# Patient Record
Sex: Female | Born: 1980 | Race: White | Hispanic: No | Marital: Married | Smoking: Former smoker
Health system: Southern US, Community
[De-identification: ages and names within clinical notes are randomized; demographics above are authoritative.]

## PROBLEM LIST (undated history)

## (undated) DIAGNOSIS — F419 Anxiety disorder, unspecified: Secondary | ICD-10-CM

## (undated) DIAGNOSIS — F329 Major depressive disorder, single episode, unspecified: Secondary | ICD-10-CM

## (undated) DIAGNOSIS — G43109 Migraine with aura, not intractable, without status migrainosus: Secondary | ICD-10-CM

## (undated) DIAGNOSIS — F32A Depression, unspecified: Secondary | ICD-10-CM

## (undated) DIAGNOSIS — N946 Dysmenorrhea, unspecified: Secondary | ICD-10-CM

## (undated) DIAGNOSIS — K56609 Unspecified intestinal obstruction, unspecified as to partial versus complete obstruction: Secondary | ICD-10-CM

## (undated) DIAGNOSIS — Z975 Presence of (intrauterine) contraceptive device: Secondary | ICD-10-CM

## (undated) HISTORY — DX: Presence of (intrauterine) contraceptive device: Z97.5

## (undated) HISTORY — DX: Dysmenorrhea, unspecified: N94.6

## (undated) HISTORY — DX: Depression, unspecified: F32.A

## (undated) HISTORY — PX: WISDOM TOOTH EXTRACTION: SHX21

## (undated) HISTORY — DX: Anxiety disorder, unspecified: F41.9

## (undated) HISTORY — DX: Migraine with aura, not intractable, without status migrainosus: G43.109

## (undated) HISTORY — DX: Major depressive disorder, single episode, unspecified: F32.9

---

## 1998-06-25 HISTORY — PX: REFRACTIVE SURGERY: SHX103

## 2006-11-05 ENCOUNTER — Ambulatory Visit: Payer: Self-pay | Admitting: Gastroenterology

## 2006-11-05 LAB — CONVERTED CEMR LAB
ALT: 19 units/L (ref 0–40)
Alkaline Phosphatase: 59 units/L (ref 39–117)
BUN: 14 mg/dL (ref 6–23)
Basophils Relative: 0.2 % (ref 0.0–1.0)
CO2: 28 meq/L (ref 19–32)
Calcium: 9.1 mg/dL (ref 8.4–10.5)
GFR calc Af Amer: 154 mL/min
Lymphocytes Relative: 22.8 % (ref 12.0–46.0)
Monocytes Relative: 3.7 % (ref 3.0–11.0)
Neutro Abs: 4.3 10*3/uL (ref 1.4–7.7)
Platelets: 245 10*3/uL (ref 150–400)
RBC: 4.63 M/uL (ref 3.87–5.11)
RDW: 12.4 % (ref 11.5–14.6)
Total Protein: 7 g/dL (ref 6.0–8.3)

## 2006-12-10 ENCOUNTER — Ambulatory Visit: Payer: Self-pay | Admitting: Gastroenterology

## 2006-12-10 ENCOUNTER — Encounter: Payer: Self-pay | Admitting: Internal Medicine

## 2006-12-16 ENCOUNTER — Ambulatory Visit: Payer: Self-pay | Admitting: Internal Medicine

## 2007-02-12 ENCOUNTER — Ambulatory Visit: Payer: Self-pay | Admitting: Gastroenterology

## 2007-02-12 LAB — CONVERTED CEMR LAB
Crystals: NEGATIVE
Nitrite: NEGATIVE
Specific Gravity, Urine: 1.015 (ref 1.000–1.03)

## 2007-09-29 DIAGNOSIS — F419 Anxiety disorder, unspecified: Secondary | ICD-10-CM

## 2007-09-29 DIAGNOSIS — F329 Major depressive disorder, single episode, unspecified: Secondary | ICD-10-CM

## 2007-09-29 DIAGNOSIS — K589 Irritable bowel syndrome without diarrhea: Secondary | ICD-10-CM

## 2007-09-29 DIAGNOSIS — F32A Depression, unspecified: Secondary | ICD-10-CM | POA: Insufficient documentation

## 2007-09-29 DIAGNOSIS — K219 Gastro-esophageal reflux disease without esophagitis: Secondary | ICD-10-CM

## 2007-09-29 DIAGNOSIS — R1011 Right upper quadrant pain: Secondary | ICD-10-CM

## 2010-11-07 NOTE — Assessment & Plan Note (Signed)
[facility]                         GASTROENTEROLOGY OFFICE NOTE   NAME:Ortiz, Shelby                       MRN:          [id number]  DATE:02/12/2007                            DOB:          Jan 04, 1981    GASTROINTESTINAL PROBLEM LIST:  1. 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  CBC, complete metabolic      profile, thyroid testing May 2008 all normal.  TTG was normal.      Ultrasound June 2007 normal.  2. Gastroesophageal reflux disease.  Classic pyrosis, improved on once-      daily proton pump inhibitor, as well as decreasing caffeine intake.   If you could please incorporate the exact GI problem list from the note  dated December 10, 2006 with zero changes. Additionally, I do want to add  one new thing to the end of sentence #1:  I want to add   INTERVAL HISTORY:  I last saw Shelby Ortiz about two months ago. Since then,  I did a TTG test and it was negative. She tried Gas-X, but that did not  help with her daily bloating. She continues to have a vague compilation  of intermittent abdominal discomforts, cramping, blurry vision. She is a  little bit more specific about her bowel patterns and saying that she  generally will have constipation followed by loose stools, and that does  seem to have some cramping that is improved when she finally moves her  bowels.   CURRENT MEDICATIONS:  1. Aciphex.  2. Birth control pill.   PHYSICAL EXAMINATION:  Weight 147 pounds, up 3 pounds since her last  visit. Blood pressure 120/72, pulse 92.  CONSTITUTIONAL:  Generally well appearing.  ABDOMEN:  Soft, nontender, nondistended, normal bowel sounds.   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   She will try Citrucel fiber supplements, titrating slowly upwards to see  if we can help her alternating  constipation and loose stools. I also  called her in a prescription for Levsin sublingual to be taken with  cramping. She recently had a urinary tract infection and was recommended  to get a repeat urinalysis, and I will be happy to do that for her. I  will communicate the results with those directly with her. She will  return to see me in six weeks or sooner if needed.     Rachael Fee, MD  Electronically Signed   DPJ/MedQ  DD: 02/12/2007  DT: 02/13/2007  Job #: [id number]

## 2010-11-07 NOTE — Assessment & Plan Note (Signed)
[facility]                         GASTROENTEROLOGY OFFICE NOTE   [name], [name]                       MRN:          [id number]  DATE:11/05/2006                            DOB:          1980-11-09    CHIEF COMPLAINT:  Digestive issues and abdominal discomfort.   HISTORY OF PRESENT ILLNESS:  [name] is a pleasant, 30 year old woman  who has had a variety of chronic upper GI issues. First she has had a  right-sided pain for at least 2-3 years that is intermittent, achy and  will last for approximately 2 hours. There are no reliable triggers and  it usually goes away on its own. She had this worked up in [place],  [place] with lab tests and imaging studies. She tells me that the  ultrasound showed no gallstones and the lab tests were essentially  normal. That pain has somewhat improved. She is a bit more bothered now  by a new mid epigastric pain that is post prandial and happens often at  night when laying down. It is usually a sharp pain lasting at most 10  minutes. It does not cause nausea or vomiting. The pain can also be  postprandial. This has been going on for at least 2-3 weeks. She has  also had about a year pyrosis and acid regurgitation. She does not have  true dysphagia.   REVIEW OF SYSTEMS:  Notable for a 10-15 pound weight gain in the past  year, urinary discomfort, sleeping problems, blurry vision, anxiety,  depression.  The rest of her review of systems is essentially normal and is available  on the nursing intake sheet.   PAST MEDICAL HISTORY:  Anxiety, depression, chronic headaches, wisdom  teeth removal, LASIK eye surgery.   CURRENT MEDICATIONS:  Birth control pills, multivitamin, calcium,  vitamin B, betacarotene, a medicine called Host Defense, a medicine  called Happy Camper pill, some digestive enzymes.   ALLERGIES:  No true drug allergies but she does have an intolerance to  Sudafed.   SOCIAL HISTORY:   Single, lives with her boyfriend, works in [employer]. Lived in [place] for a year up until August 2007. Traveled a  lot in [place]. Had 2-3 episodes of gastroenteritis while she  was there. Nonsmoker, drinks alcohol rarely.   FAMILY HISTORY:  Maternal aunt and maternal grandmother with breast  cancer, father with diabetes. No colon cancer or colon polyp in family.   PHYSICAL EXAMINATION:  VITAL SIGNS:  5 foot 1 inches, 143 pounds. Blood  pressure 132/90, pulse 84.  CONSTITUTIONAL:  Generally well appearing.  NEUROLOGIC:  Alert and oriented x3.  EYES:  Extraocular movements intact.  MOUTH:  Oropharynx moist no lesions.  NECK:  Supple, no lymphadenopathy.  CARDIOVASCULAR:  Heart regular rate and rhythm.  LUNGS:  Clear to auscultation bilaterally.  ABDOMEN:  Soft, nontender, nondistended, normal bowel sounds.  EXTREMITIES:  No lower extremity edema.  SKIN:  No rashes or lesions on visible extremities.   ASSESSMENT/PLAN:  A 30 year old woman with multiple upper  gastrointestinal complaints.   We will have her sign a  release so we can get the ultrasound report from  [place], [place] sent over. Her symptoms do not seem biliary.  She tells me the ultrasound was normal. Should also get a repeat set of  labs today including a CBC, a complete metabolic profile and thyroid  testing. She has classic GERD symptoms with pyrosis, acid regurgitation  and perhaps her epigastric discomfort is GERD related dyspepsia and so I  am going to start her empirically on a trial of PPI.  She knows to take  it 20-30 minutes prior to her breakfast meal. If she does not get any  decent relief after 4 weeks of trials of proton pump inhibitor or her  labs are abnormal, we will have to consider further workup possibly with  endoscopy. She is also interested in getting set up with a new primary  care physician. She specifically has an issue of intermittent syncope.  It has happened probably 3  or 4 times in the past 10 years, the last  time being over a year ago. She wants to discuss this with her primary  care physician.     Rachael Fee, MD  Electronically Signed    DPJ/MedQ  DD: 11/05/2006  DT: 11/05/2006  Job #: [id number]

## 2010-11-07 NOTE — Assessment & Plan Note (Signed)
[facility]                         GASTROENTEROLOGY OFFICE NOTE   TRANY, CHERNICK                       MRN:          [id number]  DATE:12/10/2006                            DOB:          [birth year]    GI PROBLEM LIST:  1. 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  CBC, complete metabolic      profile, thyroid testing May 2008 all normal.  Ultrasound June 2007      normal.  2. Gastroesophageal reflux disease.  Classic pyrosis, improved on once-      daily proton pump inhibitor, as well as decreasing caffeine intake.   INTERVAL HISTORY:  I last saw Shelby Ortiz 1 month ago.  Since then, she has  been on Aciphex once daily, and has noticed a definite improvement in  her rather classic GERD symptoms.  She is still having bloating  discomfort, especially late in the evening, and this is usually  associated with right-sided abdominal discomforts as well.  She again  mentioned blurry vision, which happens for about 1 hour a day.  She is  also complaining of inflamed lips and swelling in the roof of her mouth.  She got a glucose meter free at work, and has been testing her glucose,  and says when her sugars are in the 70s she seems to have some fatigue,  and some pallor.  She has already arranged to see a primary care  physician next week to discuss some of these symptoms.   CURRENT MEDICATIONS:  1. Digestive enzymes.  2. Aciphex 20 mg once daily.  3. Birth control pill.   PHYSICAL EXAMINATION:  Weight 144 pounds, which is up 1 pound since her  last visit.  Blood pressure 96/68.  Pulse 78.  CONSTITUTIONAL:  Generally well appearing.  LUNGS:  Clear to auscultation bilaterally.  HEART:  Regular rate and rhythm.  ABDOMEN:  Soft.  Non-tender.  Non-distended.  Normal bowel sounds.   ASSESSMENT AND PLAN:  A 30 year old woman with gastroesophageal reflux  disease and possibly  gastroesophageal reflux disease related dyspepsia,  possibly dietary intolerances.   Her biggest gastrointestinal issues now are some bloating and some right-  sided abdominal discomfort that usually happen late in the evening after  her dinner meal.  I recommended she begin taking 2 Gas-X with her dinner  meal on a daily basis.  I will also call her in a prescription for  proton pump inhibitor, as that definitely seems to help her more classic  gastroesophageal reflux disease symptoms.  She brought up the  possibility of celiac sprue.  I think this is a very good idea.  She is  of [place] heritage, and so we will get a TTG test done.  She  will also  keep a food journal to see if we can ferret out any specific foods that  seem to be causing her  symptoms.  She is not a big milk drinker.  She will return to see me in  6 to 8 weeks, and sooner if needed.  Rachael Fee, MD  Electronically Signed    DPJ/MedQ  DD: 12/10/2006  DT: 12/10/2006  Job #: [id number]

## 2011-01-05 ENCOUNTER — Other Ambulatory Visit: Payer: Self-pay | Admitting: Obstetrics and Gynecology

## 2013-01-13 ENCOUNTER — Encounter: Payer: Self-pay | Admitting: Gynecology

## 2013-01-14 ENCOUNTER — Encounter: Payer: Self-pay | Admitting: Gynecology

## 2013-01-15 ENCOUNTER — Encounter: Payer: Self-pay | Admitting: Gynecology

## 2013-01-15 ENCOUNTER — Ambulatory Visit (INDEPENDENT_AMBULATORY_CARE_PROVIDER_SITE_OTHER): Payer: No Typology Code available for payment source | Admitting: Gynecology

## 2013-01-15 VITALS — BP 116/72 | HR 82 | Resp 16 | Ht 62.0 in | Wt <= 1120 oz

## 2013-01-15 DIAGNOSIS — Z124 Encounter for screening for malignant neoplasm of cervix: Secondary | ICD-10-CM

## 2013-01-15 DIAGNOSIS — G43109 Migraine with aura, not intractable, without status migrainosus: Secondary | ICD-10-CM | POA: Insufficient documentation

## 2013-01-15 DIAGNOSIS — Z01419 Encounter for gynecological examination (general) (routine) without abnormal findings: Secondary | ICD-10-CM

## 2013-01-15 DIAGNOSIS — Z309 Encounter for contraceptive management, unspecified: Secondary | ICD-10-CM

## 2013-01-15 DIAGNOSIS — Z0000 Encounter for general adult medical examination without abnormal findings: Secondary | ICD-10-CM

## 2013-01-15 LAB — POCT URINALYSIS DIPSTICK
Blood, UA: 1
Ketones, UA: NEGATIVE

## 2013-01-15 LAB — HEMOGLOBIN, FINGERSTICK: Hemoglobin, fingerstick: 13.5 g/dL (ref 12.0–16.0)

## 2013-01-15 NOTE — Patient Instructions (Signed)
Contraceptive Implant Information A contraceptive implant is a plastic rod that is inserted under the skin. It is usually inserted under the skin of your upper arm. It continually releases small amounts of progestin (synthetic progesterone) into the bloodstream. This prevents an egg from being released from the ovary. It also thickens the cervical mucus to prevent sperm from entering the cervix, and it thins the uterine lining to prevent a fertilized egg from attaching to the uterus. They can be effective for up to 3 years. Implants do not provide protection against sexually transmitted diseases (STDs).  The procedure to insert an implant usually takes about 10 minutes. There may be minor bruising, swelling, and discomfort at the insertion site for a couple days. The implant begins to work within the first day. Other contraceptive protection should be used for 2 weeks. Follow up with your caregiver to get rechecked as directed. Your caregiver will make sure you are a good candidate for the contraceptive implant. Discuss with your caregiver the possible side effects of the implant ADVANTAGES  It prevents pregnancy for up to 3 years.  It is easily reversible.  It is convenient.  The progestins may protect against uterine and ovarian cancer.  It can be used when breastfeeding.  It can be used by women who cannot take estrogen. DISADVANTAGES  You may have irregular or unplanned vaginal bleeding.  You may develop side effects, including headache, weight gain, acne, breast tenderness, or mood changes.  You may have tissue or nerve damage after insertion (rare).  It may be difficult and uncomfortable to remove.  Certain medications may interfere with the effectiveness of the implants. REMOVAL OF IMPLANT The implant should be removed in 3 years or as directed by your caregiver. The implants effect wears off in a few hours after removal. Your ability to get pregnant (fertility) is restored  within a couple of weeks. New implants can be inserted as soon as the old ones are removed if desired. DO NOT GET THE IMPLANT IF:   You are pregnant.  You have a history of breast cancer, osteoporosis, blood clots, heart disease, diabetes, high blood pressure, liver disease, tumors, or stroke.   You have undiagnosed vaginal bleeding.  You have overly sensitive to certain parts of the implant. Document Released: 05/31/2011 Document Revised: 09/03/2011 Document Reviewed: 05/31/2011 [facility] Patient Information 2014 [place], [place]. Levonorgestrel intrauterine device (IUD) What is this medicine? LEVONORGESTREL IUD (LEE voe nor jes trel) is a contraceptive (birth control) device. It is used to prevent pregnancy and to treat heavy bleeding that occurs during your period. It can be used for up to 5 years. This medicine may be used for other purposes; ask your health care provider or pharmacist if you have questions. What should I tell my health care provider before I take this medicine? They need to know if you have any of these conditions: -abnormal Pap smear -cancer of the breast, uterus, or cervix -diabetes -endometritis -genital or pelvic infection now or in the past -have more than one sexual partner or your partner has more than one partner -heart disease -history of an ectopic or tubal pregnancy -immune system problems -IUD in place -liver disease or tumor -problems with blood clots or take blood-thinners -use intravenous drugs -uterus of unusual shape -vaginal bleeding that has not been explained -an unusual or allergic reaction to levonorgestrel, other hormones, silicone, or polyethylene, medicines, foods, dyes, or preservatives -pregnant or trying to get pregnant -breast-feeding How should I use this medicine? This  device is placed inside the uterus by a health care professional. Talk to your pediatrician regarding the use of this medicine in children. Special care may be  needed. Overdosage: If you think you have taken too much of this medicine contact a poison control center or emergency room at once. NOTE: This medicine is only for you. Do not share this medicine with others. What if I miss a dose? This does not apply. What may interact with this medicine? Do not take this medicine with any of the following medications: -amprenavir -bosentan -fosamprenavir This medicine may also interact with the following medications: -aprepitant -barbiturate medicines for inducing sleep or treating seizures -bexarotene -griseofulvin -medicines to treat seizures like carbamazepine, ethotoin, felbamate, oxcarbazepine, phenytoin, topiramate -modafinil -pioglitazone -rifabutin -rifampin -rifapentine -some medicines to treat HIV infection like atazanavir, indinavir, lopinavir, nelfinavir, tipranavir, ritonavir -St. John's wort -warfarin This list may not describe all possible interactions. Give your health care provider a list of all the medicines, herbs, non-prescription drugs, or dietary supplements you use. Also tell them if you smoke, drink alcohol, or use illegal drugs. Some items may interact with your medicine. What should I watch for while using this medicine? Visit your doctor or health care professional for regular check ups. See your doctor if you or your partner has sexual contact with others, becomes HIV positive, or gets a sexual transmitted disease. This product does not protect you against HIV infection (AIDS) or other sexually transmitted diseases. You can check the placement of the IUD yourself by reaching up to the top of your vagina with clean fingers to feel the threads. Do not pull on the threads. It is a good habit to check placement after each menstrual period. Call your doctor right away if you feel more of the IUD than just the threads or if you cannot feel the threads at all. The IUD may come out by itself. You may become pregnant if the device  comes out. If you notice that the IUD has come out use a backup birth control method like condoms and call your health care provider. Using tampons will not change the position of the IUD and are okay to use during your period. What side effects may I notice from receiving this medicine? Side effects that you should report to your doctor or health care professional as soon as possible: -allergic reactions like skin rash, itching or hives, swelling of the face, lips, or tongue -fever, flu-like symptoms -genital sores -high blood pressure -no menstrual period for 6 weeks during use -pain, swelling, warmth in the leg -pelvic pain or tenderness -severe or sudden headache -signs of pregnancy -stomach cramping -sudden shortness of breath -trouble with balance, talking, or walking -unusual vaginal bleeding, discharge -yellowing of the eyes or skin Side effects that usually do not require medical attention (report to your doctor or health care professional if they continue or are bothersome): -acne -breast pain -change in sex drive or performance -changes in weight -cramping, dizziness, or faintness while the device is being inserted -headache -irregular menstrual bleeding within first 3 to 6 months of use -nausea This list may not describe all possible side effects. Call your doctor for medical advice about side effects. You may report side effects to FDA at 1-800-FDA-1088. Where should I keep my medicine? This does not apply. NOTE: This sheet is a summary. It may not cover all possible information. If you have questions about this medicine, talk to your doctor, pharmacist, or health care provider.  2012,  Elsevier/Gold Standard. (07/02/2008 6:39:08 PM)

## 2013-01-15 NOTE — Addendum Note (Signed)
Addended by: Lorraine Lax on: 01/15/2013 03:37 PM   Modules accepted: Orders

## 2013-01-15 NOTE — Progress Notes (Addendum)
32 y.o. married Caucasian female  G0  here for annual exam. Pt is currently sexually active. Pt is interested in contraception as they are not planning on children.  Pt reports pain with condom use.  Pt had used ocp in past for contraception and dysmenorrhea.  Pt stopped due to moving, would like to resume. Pt reports shortness of breath and wheezing but denies any asthma, was seen by MD felt to be benign, pt has been overseas [place] a few times.  Patient's last menstrual period was 12/27/2012.          Sexually active: yes  The current method of family planning is condoms.    Exercising: yes  walking and hiking sometimes Last pap: Summer 2009 Alcohol: 1-4 drinks/wk (depends) Tobacco: no BSE: no  Hgb: 13.5       Urine: Blood 1  Health Maintenance  Topic Date Due  . Pap Smear  01/08/1997  . Tetanus/tdap  01/08/1998  . Influenza Vaccine  02/23/2013    Family History  Problem Relation Age of Onset  . Diabetes Father   . Lung cancer Maternal Uncle   . Breast cancer Paternal Aunt   . Breast cancer Maternal Grandmother     Patient Active Problem List   Diagnosis Date Noted  . Ocular migraine   . Migraine with aura   . ANXIETY 09/29/2007  . DEPRESSION 09/29/2007  . GERD 09/29/2007  . IRRITABLE BOWEL SYNDROME 09/29/2007  . RUQ PAIN 09/29/2007    Past Medical History  Diagnosis Date  . Anxiety   . Depression   . Dysmenorrhea   . Ocular migraine   . Migraine with aura     Past Surgical History  Procedure Laterality Date  . Wisdom tooth extraction  Age [age]  . Eye surgery  Age 39    Lasix    Allergies: Sudafed  Current Outpatient Prescriptions  Medication Sig Dispense Refill  . Aspirin-Acetaminophen-Caffeine (EXCEDRIN PO) Take by mouth as needed.      . Ibuprofen (MIDOL PO) Take by mouth as needed.      . IBUPROFEN PO Take by mouth as needed.      . Multiple Vitamins-Minerals (MULTIVITAMIN PO) Take by mouth.      Marland Kitchen OVER THE COUNTER MEDICATION as needed. Rescue  Remedy for anxiety       No current facility-administered medications for this visit.    ROS: Pertinent items are noted in HPI.  Exam:    BP 116/72  Pulse 82  Resp 16  Ht 5\' 2"  (1.575 m)  Wt 17 lb (7.711 kg)  BMI 3.11 kg/m2  LMP 12/27/2012 Weight change: @WEIGHTCHANGE @ Last 3 height recordings:  Ht Readings from Last 3 Encounters:  01/15/13 5\' 2"  (1.575 m)   General appearance: alert, cooperative and appears stated age Head: Normocephalic, without obvious abnormality, atraumatic Neck: no adenopathy, no carotid bruit, no JVD, supple, symmetrical, trachea midline and thyroid not enlarged, symmetric, no tenderness/mass/nodules Lungs: clear to auscultation bilaterally Breasts: normal appearance, no masses or tenderness Heart: regular rate and rhythm, S1, S2 normal, no murmur, click, rub or gallop Abdomen: soft, non-tender; bowel sounds normal; no masses,  no organomegaly Extremities: extremities normal, atraumatic, no cyanosis or edema Skin: Skin color, texture, turgor normal. No rashes or lesions Lymph nodes: Cervical, supraclavicular, and axillary nodes normal. no inguinal nodes palpated Neurologic: Grossly normal   Pelvic: External genitalia:  no lesions              Urethra: normal appearing urethra  with no masses, tenderness or lesions              Bartholins and Skenes: normal                 Vagina: normal appearing vagina with normal color and discharge, no lesions              Cervix: normal appearance              Pap taken: yes        Bimanual Exam:  Uterus:  uterus is normal size, shape, consistency and nontender                                      Adnexa:    normal adnexa in size, nontender and no masses                                      Rectovaginal: Confirms                                      Anus:  normal sphincter tone, no lesions  A: well woman Contraceptive management Migraines with aura     P: pap smear with HPV Discussed contraceptive  options-progestin only due to migraines with aura-implant, IUD depoprovera, minipill.  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

## 2013-01-15 NOTE — Addendum Note (Signed)
Addended by: Douglass Rivers on: 01/15/2013 03:22 PM   Modules accepted: Orders

## 2013-01-19 ENCOUNTER — Telehealth: Payer: Self-pay | Admitting: *Deleted

## 2013-01-19 NOTE — Telephone Encounter (Signed)
Message copied by Lorraine Lax on Mon Jan 19, 2013 12:08 PM ------      Message from: Douglass Rivers      Created: Mon Jan 19, 2013 11:25 AM       Inform negative pap and hpv, recall 2 ------

## 2013-01-19 NOTE — Telephone Encounter (Signed)
Left Message To Call Back  

## 2013-01-20 NOTE — Telephone Encounter (Signed)
Patient notified see labs 

## 2013-01-20 NOTE — Telephone Encounter (Signed)
Returning a call to Jasmine. °

## 2013-01-27 ENCOUNTER — Telehealth: Payer: Self-pay | Admitting: Gynecology

## 2013-01-27 NOTE — Telephone Encounter (Signed)
Spoke with pt about pain in side. Pt denies fever, urinary frequency, urgency, or burning. Advised her urinalysis looked fine at her last visit. Pt states, "Maybe it is just muscular." Advised pt to see PCP when she gets back in town if pain persists. Pt agreeable.

## 2013-01-27 NOTE — Telephone Encounter (Signed)
Called patient to inform her of her benfits for IUD insertion. Patient agrees. Will call next month with cycle since she is currently on cycle and out of town. Advised patient to call of first day of cycle so we can get her scheduled during the first 5 days. Patient was asking about her urine test results since she is experiencing lower left side pain that is radiating toward her back. She does have cysts on her right side. She is not sure if the pain is related to the cysts, her cycle, or carrying her bags on her right side since she is traveling. She wanted to make sure that the test didn't show anything abnormal. She said at times it feels like kidney pain but it comes and goes.

## 2013-02-23 DIAGNOSIS — Z975 Presence of (intrauterine) contraceptive device: Secondary | ICD-10-CM

## 2013-02-23 HISTORY — DX: Presence of (intrauterine) contraceptive device: Z97.5

## 2013-02-27 ENCOUNTER — Telehealth: Payer: Self-pay | Admitting: Gynecology

## 2013-02-27 ENCOUNTER — Other Ambulatory Visit: Payer: Self-pay | Admitting: Gynecology

## 2013-02-27 DIAGNOSIS — Z309 Encounter for contraceptive management, unspecified: Secondary | ICD-10-CM

## 2013-02-27 MED ORDER — MISOPROSTOL 200 MCG PO TABS: ORAL_TABLET | ORAL | Status: DC

## 2013-02-27 NOTE — Telephone Encounter (Signed)
Patient called to schedule IUD insertion(skyla) appt scheduled for 03/02/13 with Dr. Farrel Gobble Pt had insurance questions call forwarded to [facility] E. cm

## 2013-02-27 NOTE — Telephone Encounter (Signed)
Pt started her period and calling to schedule IUD appointment.

## 2013-03-02 ENCOUNTER — Ambulatory Visit (INDEPENDENT_AMBULATORY_CARE_PROVIDER_SITE_OTHER): Payer: No Typology Code available for payment source | Admitting: Gynecology

## 2013-03-02 ENCOUNTER — Encounter: Payer: Self-pay | Admitting: Gynecology

## 2013-03-02 VITALS — BP 116/62 | HR 74 | Resp 14 | Ht 62.0 in | Wt 172.0 lb

## 2013-03-02 DIAGNOSIS — Z309 Encounter for contraceptive management, unspecified: Secondary | ICD-10-CM

## 2013-03-02 DIAGNOSIS — Z30430 Encounter for insertion of intrauterine contraceptive device: Secondary | ICD-10-CM

## 2013-03-02 NOTE — Procedures (Signed)
30 yrsMarried Caucasian female presents for  insertion of skyla. Denies any vaginal symptoms or STD concerns.  LMP: 02/28/2103  Patient read information regarding IUD insertion.  All questions addressed.    Healthy female,time, place and personnormal menses, no abnormal bleeding, pelvic pain or discharge, no breast pain or new or enlarging lumps on self exam Abdomen: soft, non-tender Groinno inguinal nodes palpated  Pelvic exam: Vulva;normal female genitalia  Vagina:normal vagina  Cervix:Non-tender, Negative CMT, no lesions or redness, nulliparous/parous os  Uterus:normal shape, position and consistency    Procedure:  Bimanual exam, uterus anteverted, mobile nontender.  Speculum inserted into vagina. Cervix visualized and cleansed with betadine solution X 3. Tenaculum placed on cervix at 12 o'clock position(s).  Uterus sounded to 7 centimeters.  IUD removed from sterile packet and under sterile conditions inserted to fundus of uterus.  Introducer removed without difficulty.  IUD string trimmed to 2 centimeters.  Remainder string given to patient to feel for identification.  Tenaculum removed.  No bleeding noted.  Speculum removed.  Uterus palpated normal.  Patient tolerated procedure well.  A: Insertion of Lot # [id number], Expiration date 1/17, SKYLA   P:  Instructions and warnings signs given.       IUD identification card given with IUD removal 02/2016       Return visit 1 month

## 2013-03-02 NOTE — Patient Instructions (Signed)
IUD PLACEMENT POST PROCEDURE INSTRUCTIONS  1. You may take Ibuprofen, Aleve or Tylenol for pain as needed.      Cramping should resolve within 24 hours.  2. You may have a small amount of spotting. You should wear a mini pad for the next few days  3. You may have intercourse after 24 hours. If you are using this for birth control, it is effective immediately.  4. You need to call if you have any pelvic pain, fever, heavy bleeding or foul smelling vaginal discharge. Irregular bleeding is common the first several months after having an IUD placed. You do not need to for this reason unless you are                     concerned.   5. Shower or bathe as normal  6. You should have a follow-up appointment in 4-8 weeks for a re-check to make sure you are not having any problems.  

## 2013-04-01 ENCOUNTER — Encounter: Payer: Self-pay | Admitting: Gynecology

## 2013-04-01 ENCOUNTER — Ambulatory Visit (INDEPENDENT_AMBULATORY_CARE_PROVIDER_SITE_OTHER): Payer: No Typology Code available for payment source | Admitting: Gynecology

## 2013-04-01 VITALS — BP 120/70 | HR 72 | Resp 16 | Ht 62.0 in | Wt 174.0 lb

## 2013-04-01 DIAGNOSIS — Z30431 Encounter for routine checking of intrauterine contraceptive device: Secondary | ICD-10-CM

## 2013-04-01 NOTE — Progress Notes (Signed)
Pt here for IUD check had skyla placed 1 month ago, pt reports cramping better, denies dyspareunia.  Pt had 1 migraine this month- tends to get with menses.  Pt reports menses longer but lighter.  Happy with choice.  No fever or chills  ROS as above  BP 120/70  Pulse 72  Resp 16  Ht 5\' 2"  (1.575 m)  Wt 174 lb (78.926 kg)  BMI 31.82 kg/m2  LMP 03/24/2013 General appearance: alert, cooperative and appears stated age Abdomen: soft nontender Pelvic: cervix normal in appearance, external genitalia normal, no adnexal masses or tenderness, no cervical motion tenderness, uterus normal size, shape, and consistency, vagina normal without discharge and IUD string seen  Assessment: IUD check  Plan:  Doing well  Pt assured F/u prn

## 2013-12-11 ENCOUNTER — Encounter: Payer: Self-pay | Admitting: Gynecology

## 2014-01-18 ENCOUNTER — Ambulatory Visit: Payer: No Typology Code available for payment source | Admitting: Gynecology

## 2014-01-22 ENCOUNTER — Ambulatory Visit: Payer: No Typology Code available for payment source | Admitting: Gynecology

## 2014-02-16 ENCOUNTER — Ambulatory Visit: Payer: No Typology Code available for payment source | Admitting: Gynecology

## 2014-02-24 ENCOUNTER — Encounter: Payer: Self-pay | Admitting: Gynecology

## 2014-02-24 ENCOUNTER — Ambulatory Visit (INDEPENDENT_AMBULATORY_CARE_PROVIDER_SITE_OTHER): Payer: No Typology Code available for payment source | Admitting: Gynecology

## 2014-02-24 VITALS — BP 116/68 | HR 78 | Resp 16 | Ht 62.0 in | Wt 173.0 lb

## 2014-02-24 DIAGNOSIS — G43109 Migraine with aura, not intractable, without status migrainosus: Secondary | ICD-10-CM

## 2014-02-24 DIAGNOSIS — Z01419 Encounter for gynecological examination (general) (routine) without abnormal findings: Secondary | ICD-10-CM

## 2014-02-24 DIAGNOSIS — Z0000 Encounter for general adult medical examination without abnormal findings: Secondary | ICD-10-CM

## 2014-02-24 LAB — HEMOGLOBIN, FINGERSTICK: Hemoglobin, fingerstick: 14 g/dL (ref 12.0–16.0)

## 2014-02-24 MED ORDER — PROMETHAZINE HCL 25 MG RE SUPP: 25.0000 mg | Freq: Four times a day (QID) | RECTAL | Status: DC | PRN

## 2014-02-24 NOTE — Progress Notes (Signed)
33 y.o. Married Caucasian female   No obstetric history on file. here for annual exam. Pt is currently sexually active. Pt has been having very light if any bleeding with IUD, very happy with choice.  Migraines are getting better but can have protracted emesis.  No dyspareunia.   Patient's last menstrual period was 02/11/2014.          Sexually active: Yes.    The current method of family planning is Skyla IUD.    Exercising: Yes.    walking qd ,hiking weekends Last pap:  01/15/13 NEG HR HPV Alcohol: 2 Drinks/wk Tobacco: no BSE: no  Hgb: 14.0, Urine: Unable to void    Health Maintenance  Topic Date Due  . Tetanus/tdap  01/08/1998  . Influenza Vaccine  01/23/2014  . Pap Smear  01/16/2016    Family History  Problem Relation Age of Onset  . Diabetes Father   . Lung cancer Maternal Uncle   . Breast cancer Paternal Aunt   . Breast cancer Maternal Grandmother     Patient Active Problem List   Diagnosis Date Noted  . IUD (intrauterine device) in place 02/23/2013  . Ocular migraine   . Migraine with aura   . ANXIETY 09/29/2007  . DEPRESSION 09/29/2007  . GERD 09/29/2007  . IRRITABLE BOWEL SYNDROME 09/29/2007  . RUQ PAIN 09/29/2007    Past Medical History  Diagnosis Date  . Anxiety   . Depression   . Dysmenorrhea   . Ocular migraine   . Migraine with aura   . IUD (intrauterine device) in place 02/2013    skyla    Past Surgical History  Procedure Laterality Date  . Wisdom tooth extraction  Age [age]  . Eye surgery  Age 67    Lasix    Allergies: Sudafed  Current Outpatient Prescriptions  Medication Sig Dispense Refill  . Aspirin-Acetaminophen-Caffeine (EXCEDRIN PO) Take by mouth as needed.      . Ibuprofen (MIDOL PO) Take by mouth as needed.      . IBUPROFEN PO Take by mouth as needed.      . Levonorgestrel (SKYLA) 13.5 MG IUD by Intrauterine route.      . Multiple Vitamins-Minerals (MULTIVITAMIN PO) Take by mouth.      Marland Kitchen OVER THE COUNTER MEDICATION as needed.  Rescue Remedy for anxiety       No current facility-administered medications for this visit.    ROS: Pertinent items are noted in HPI.  Exam:    Resp 16  Ht 5\' 2"  (1.575 m)  Wt 173 lb (78.472 kg)  BMI 31.63 kg/m2  LMP 02/11/2014 Weight change: @WEIGHTCHANGE @ Last 3 height recordings:  Ht Readings from Last 3 Encounters:  02/24/14 5\' 2"  (1.575 m)  04/01/13 5\' 2"  (1.575 m)  03/02/13 5\' 2"  (1.575 m)   General appearance: alert, cooperative and appears stated age Head: Normocephalic, without obvious abnormality, atraumatic Neck: no adenopathy, no carotid bruit, no JVD, supple, symmetrical, trachea midline and thyroid not enlarged, symmetric, no tenderness/mass/nodules Lungs: clear to auscultation bilaterally Breasts: normal appearance, no masses or tenderness Heart: regular rate and rhythm, S1, S2 normal, no murmur, click, rub or gallop Abdomen: soft, non-tender; bowel sounds normal; no masses,  no organomegaly Extremities: extremities normal, atraumatic, no cyanosis or edema Skin: Skin color, texture, turgor normal. No rashes or lesions Lymph nodes: Cervical, supraclavicular, and axillary nodes normal. no inguinal nodes palpated Neurologic: Grossly normal   Pelvic: External genitalia:  no lesions  Urethra: normal appearing urethra with no masses, tenderness or lesions              Bartholins and Skenes: Bartholin's, Urethra, Skene's normal                 Vagina: normal appearing vagina with normal color and discharge, no lesions              Cervix: normal appearance and IUD string visualized              Pap taken: No.        Bimanual Exam:  Uterus:  uterus is normal size, shape, consistency and nontender                                      Adnexa:    normal adnexa in size, nontender and no masses                                      Rectovaginal: Confirms                                      Anus:  normal sphincter tone, no lesions       1. Routine  gynecological examination  counseled on breast self exam, adequate intake of calcium and vitamin D, diet and exercise Pap smear guidelines reviewed return annually or prn  2. Laboratory examination ordered as part of a routine general medical examination  - POCT Urinalysis Dipstick  3. Migraine with aura and without status migrainosus, not intractable  - promethazine (PHENERGAN) 25 MG suppository; Place 1 suppository (25 mg total) rectally every 6 (six) hours as needed for nausea or vomiting.  Dispense: 6 each; Refill: 0  An After Visit Summary was printed and given to the patient.

## 2014-02-24 NOTE — Addendum Note (Signed)
Addended by: Lorraine Lax on: 02/24/2014 10:57 AM   Modules accepted: Orders

## 2014-05-26 ENCOUNTER — Telehealth: Payer: Self-pay

## 2014-05-26 NOTE — Telephone Encounter (Signed)
lmtcb to reschedule AEX with Dr. Lathrop 

## 2014-06-25 HISTORY — PX: SMALL INTESTINE SURGERY: SHX150

## 2014-10-14 ENCOUNTER — Other Ambulatory Visit: Payer: Self-pay | Admitting: Gastroenterology

## 2014-10-14 DIAGNOSIS — R1084 Generalized abdominal pain: Secondary | ICD-10-CM

## 2014-10-14 DIAGNOSIS — R634 Abnormal weight loss: Secondary | ICD-10-CM

## 2014-10-19 ENCOUNTER — Inpatient Hospital Stay (HOSPITAL_COMMUNITY)
Admission: EM | Admit: 2014-10-19 | Discharge: 2014-10-28 | DRG: 390 | Disposition: A | Discharge status: Home / Self Care | Payer: 59 | Attending: Internal Medicine | Admitting: Internal Medicine

## 2014-10-19 ENCOUNTER — Encounter (HOSPITAL_COMMUNITY): Payer: Self-pay

## 2014-10-19 ENCOUNTER — Ambulatory Visit
Admission: RE | Admit: 2014-10-19 | Discharge: 2014-10-19 | Disposition: A | Discharge status: Home / Self Care | Payer: Self-pay | Source: Ambulatory Visit | Attending: Gastroenterology | Admitting: Gastroenterology

## 2014-10-19 DIAGNOSIS — R739 Hyperglycemia, unspecified: Secondary | ICD-10-CM | POA: Diagnosis present

## 2014-10-19 DIAGNOSIS — D649 Anemia, unspecified: Secondary | ICD-10-CM | POA: Diagnosis present

## 2014-10-19 DIAGNOSIS — F329 Major depressive disorder, single episode, unspecified: Secondary | ICD-10-CM | POA: Diagnosis present

## 2014-10-19 DIAGNOSIS — R1084 Generalized abdominal pain: Secondary | ICD-10-CM

## 2014-10-19 DIAGNOSIS — K5090 Crohn's disease, unspecified, without complications: Secondary | ICD-10-CM

## 2014-10-19 DIAGNOSIS — R634 Abnormal weight loss: Secondary | ICD-10-CM

## 2014-10-19 DIAGNOSIS — Z09 Encounter for follow-up examination after completed treatment for conditions other than malignant neoplasm: Secondary | ICD-10-CM

## 2014-10-19 DIAGNOSIS — R109 Unspecified abdominal pain: Secondary | ICD-10-CM | POA: Diagnosis present

## 2014-10-19 DIAGNOSIS — F419 Anxiety disorder, unspecified: Secondary | ICD-10-CM | POA: Diagnosis present

## 2014-10-19 DIAGNOSIS — K56609 Unspecified intestinal obstruction, unspecified as to partial versus complete obstruction: Secondary | ICD-10-CM | POA: Diagnosis present

## 2014-10-19 DIAGNOSIS — K5669 Other intestinal obstruction: Secondary | ICD-10-CM

## 2014-10-19 DIAGNOSIS — K5660 Unspecified intestinal obstruction: Principal | ICD-10-CM | POA: Diagnosis present

## 2014-10-19 HISTORY — DX: Unspecified intestinal obstruction, unspecified as to partial versus complete obstruction: K56.609

## 2014-10-19 LAB — URINALYSIS, ROUTINE W REFLEX MICROSCOPIC
BILIRUBIN URINE: NEGATIVE
GLUCOSE, UA: NEGATIVE mg/dL
HGB URINE DIPSTICK: NEGATIVE
Ketones, ur: 40 mg/dL — AB
Leukocytes, UA: NEGATIVE
Nitrite: NEGATIVE
PH: 5 (ref 5.0–8.0)
Protein, ur: NEGATIVE mg/dL
Specific Gravity, Urine: 1.01 (ref 1.005–1.030)
Urobilinogen, UA: 1 mg/dL (ref 0.0–1.0)

## 2014-10-19 LAB — CBC WITH DIFFERENTIAL/PLATELET
Basophils Absolute: 0 10*3/uL (ref 0.0–0.1)
Basophils Relative: 0 % (ref 0–1)
EOS ABS: 0 10*3/uL (ref 0.0–0.7)
Eosinophils Relative: 0 % (ref 0–5)
HEMATOCRIT: 43.5 % (ref 36.0–46.0)
Hemoglobin: 14.8 g/dL (ref 12.0–15.0)
LYMPHS PCT: 21 % (ref 12–46)
Lymphs Abs: 1.8 10*3/uL (ref 0.7–4.0)
MCH: 26.5 pg (ref 26.0–34.0)
MCHC: 34 g/dL (ref 30.0–36.0)
MCV: 77.8 fL — ABNORMAL LOW (ref 78.0–100.0)
Monocytes Absolute: 0.5 10*3/uL (ref 0.1–1.0)
Monocytes Relative: 6 % (ref 3–12)
Neutro Abs: 6.5 10*3/uL (ref 1.7–7.7)
Neutrophils Relative %: 73 % (ref 43–77)
PLATELETS: 449 10*3/uL — AB (ref 150–400)
RBC: 5.59 MIL/uL — AB (ref 3.87–5.11)
RDW: 14.3 % (ref 11.5–15.5)
SMEAR REVIEW: ADEQUATE
WBC: 8.8 10*3/uL (ref 4.0–10.5)

## 2014-10-19 LAB — LIPASE, BLOOD: Lipase: 19 U/L (ref 11–59)

## 2014-10-19 LAB — COMPREHENSIVE METABOLIC PANEL
ALBUMIN: 3.5 g/dL (ref 3.5–5.2)
ALK PHOS: 128 U/L — AB (ref 39–117)
ALT: 25 U/L (ref 0–35)
AST: 31 U/L (ref 0–37)
Anion gap: 16 — ABNORMAL HIGH (ref 5–15)
BUN: 8 mg/dL (ref 6–23)
CO2: 23 mmol/L (ref 19–32)
Calcium: 9.3 mg/dL (ref 8.4–10.5)
Chloride: 94 mmol/L — ABNORMAL LOW (ref 96–112)
Creatinine, Ser: 0.75 mg/dL (ref 0.50–1.10)
GFR calc Af Amer: >90 mL/min (ref >90)
Glucose, Bld: 136 mg/dL — ABNORMAL HIGH (ref 70–99)
POTASSIUM: 4 mmol/L (ref 3.5–5.1)
SODIUM: 133 mmol/L — AB (ref 135–145)
TOTAL PROTEIN: 7.3 g/dL (ref 6.0–8.3)
Total Bilirubin: 0.6 mg/dL (ref 0.3–1.2)

## 2014-10-19 LAB — I-STAT CHEM 8, ED
BUN: 8 mg/dL (ref 6–23)
CALCIUM ION: 1.09 mmol/L — AB (ref 1.12–1.23)
CREATININE: 0.7 mg/dL (ref 0.50–1.10)
Chloride: 98 mmol/L (ref 96–112)
GLUCOSE: 168 mg/dL — AB (ref 70–99)
HEMATOCRIT: 45 % (ref 36.0–46.0)
HEMOGLOBIN: 15.3 g/dL — AB (ref 12.0–15.0)
Potassium: 3.7 mmol/L (ref 3.5–5.1)
SODIUM: 133 mmol/L — AB (ref 135–145)
TCO2: 20 mmol/L (ref 0–100)

## 2014-10-19 MED ORDER — SODIUM CHLORIDE 0.9 % IV SOLN: INTRAVENOUS | Status: AC

## 2014-10-19 MED ORDER — POTASSIUM CHLORIDE CRYS ER 20 MEQ PO TBCR: 40.0000 meq | EXTENDED_RELEASE_TABLET | Freq: Once | ORAL | Status: DC

## 2014-10-19 MED ORDER — IOPAMIDOL (ISOVUE-300) INJECTION 61%
100.0000 mL | Freq: Once | INTRAVENOUS | Status: AC | PRN
  Administered 2014-10-19: 100 mL via INTRAVENOUS

## 2014-10-19 MED ORDER — METOCLOPRAMIDE HCL 5 MG/ML IJ SOLN: 10.0000 mg | Freq: Once | INTRAMUSCULAR | Status: DC

## 2014-10-19 MED ORDER — MORPHINE SULFATE 2 MG/ML IJ SOLN
2.0000 mg | INTRAMUSCULAR | Status: DC | PRN
  Administered 2014-10-19 – 2014-10-20 (×3): 2 mg via INTRAVENOUS
  Administered 2014-10-20: 4 mg via INTRAVENOUS
  Administered 2014-10-20 – 2014-10-26 (×7): 2 mg via INTRAVENOUS
  Filled 2014-10-19 (×7): qty 1
  Filled 2014-10-19: qty 2
  Filled 2014-10-19 (×4): qty 1

## 2014-10-19 MED ORDER — METOCLOPRAMIDE HCL 5 MG/ML IJ SOLN
10.0000 mg | Freq: Once | INTRAMUSCULAR | Status: AC
  Administered 2014-10-19: 10 mg via INTRAVENOUS
  Filled 2014-10-19: qty 2

## 2014-10-19 MED ORDER — ONDANSETRON HCL 4 MG PO TABS: 4.0000 mg | ORAL_TABLET | Freq: Four times a day (QID) | ORAL | Status: DC | PRN

## 2014-10-19 MED ORDER — SODIUM CHLORIDE 0.9 % IV SOLN
INTRAVENOUS | Status: DC
  Administered 2014-10-20: 16:00:00 via INTRAVENOUS

## 2014-10-19 MED ORDER — HEPARIN SODIUM (PORCINE) 5000 UNIT/ML IJ SOLN
5000.0000 [IU] | Freq: Three times a day (TID) | INTRAMUSCULAR | Status: DC
  Administered 2014-10-20 – 2014-10-28 (×27): 5000 [IU] via SUBCUTANEOUS
  Filled 2014-10-19 (×27): qty 1

## 2014-10-19 MED ORDER — SODIUM CHLORIDE 0.9 % IV BOLUS (SEPSIS)
1000.0000 mL | Freq: Once | INTRAVENOUS | Status: AC
  Administered 2014-10-19: 1000 mL via INTRAVENOUS

## 2014-10-19 MED ORDER — MORPHINE SULFATE 4 MG/ML IJ SOLN
4.0000 mg | Freq: Once | INTRAMUSCULAR | Status: AC
  Administered 2014-10-19: 4 mg via INTRAVENOUS
  Filled 2014-10-19: qty 1

## 2014-10-19 MED ORDER — ONDANSETRON HCL 4 MG/2ML IJ SOLN
4.0000 mg | Freq: Four times a day (QID) | INTRAMUSCULAR | Status: DC | PRN
  Administered 2014-10-20 – 2014-10-26 (×2): 4 mg via INTRAVENOUS
  Filled 2014-10-19 (×2): qty 2

## 2014-10-19 MED ORDER — SODIUM CHLORIDE 0.9 % IV SOLN
INTRAVENOUS | Status: DC
  Administered 2014-10-19 – 2014-10-20 (×2): via INTRAVENOUS

## 2014-10-19 NOTE — ED Provider Notes (Addendum)
CSN: [id number]     Arrival date & time 10/19/14  1606 History   First MD Initiated Contact with Patient 10/19/14 1643     Chief Complaint  Patient presents with  . Abdominal Pain  . Emesis     (Consider location/radiation/quality/duration/timing/severity/associated sxs/prior Treatment) HPI [place] of abdominal bloating, diffuse crampy abdominal pain and vomiting for the past 2 months, and by lightheadedness. She's been treated with Zofran, without relief. She's vomited multiple times today including 2 times since in the emergency department. No fever. Nothing makes symptoms better or worse. Patient had outpatient CT scan of the abdomen and pelvis today, consistent with partial small bowel obstruction. Past Medical History  Diagnosis Date  . Anxiety   . Depression   . Dysmenorrhea   . Ocular migraine   . Migraine with aura   . IUD (intrauterine device) in place 02/2013    skyla   Past Surgical History  Procedure Laterality Date  . Wisdom tooth extraction  Age [age]  . Eye surgery  Age 28    Lasix   Family History  Problem Relation Age of Onset  . Diabetes Father   . Lung cancer Maternal Uncle   . Breast cancer Paternal Aunt   . Breast cancer Maternal Grandmother    History  Substance Use Topics  . Smoking status: Never Smoker   . Smokeless tobacco: Never Used  . Alcohol Use: 1.0 oz/week    2 drink(s) per week   OB History    Gravida Para Term Preterm AB TAB SAB Ectopic Multiple Living       Review of Systems  Gastrointestinal: Positive for nausea, vomiting, abdominal pain and abdominal distention.  Neurological: Positive for light-headedness.  All other systems reviewed and are negative.     Allergies  Sudafed  Home Medications   Prior to Admission medications   Medication Sig Start Date End Date Taking? Authorizing Provider  Aspirin-Acetaminophen-Caffeine (EXCEDRIN PO) Take by mouth as needed.    Historical Provider, MD  Ibuprofen  (MOTRIN PO) Take by mouth.    Historical Provider, MD  Levonorgestrel (SKYLA) 13.5 MG IUD by Intrauterine route.    Historical Provider, MD  Multiple Vitamins-Minerals (MULTIVITAMIN PO) Take by mouth.    Historical Provider, MD  OVER THE COUNTER MEDICATION as needed. Rescue Remedy for anxiety    Historical Provider, MD  promethazine (PHENERGAN) 25 MG suppository Place 1 suppository (25 mg total) rectally every 6 (six) hours as needed for nausea or vomiting. 02/24/14   Douglass Rivers, MD   BP 134/106 mmHg  Pulse 131  Temp(Src) 98 F (36.7 C) (Oral)  Resp 22  Ht  (1.549 m)  Wt 143 lb (64.864 kg)  BMI 27.03 kg/m2  SpO2 96% Physical Exam  Constitutional: She appears well-developed and well-nourished.  Ill-appearing  HENT:  Head: Normocephalic and atraumatic.  Eyes: Conjunctivae are normal. Pupils are equal, round, and reactive to light.  Neck: Neck supple. No tracheal deviation present. No thyromegaly present.  Cardiovascular: Normal rate and regular rhythm.   No murmur heard. Pulmonary/Chest: Effort normal and breath sounds normal.  Abdominal: Soft. She exhibits no distension. There is tenderness.  Mild diffuse tenderness diminished bowel sounds  Musculoskeletal: Normal range of motion. She exhibits no edema or tenderness.  Neurological: She is alert. Coordination normal.  Skin: Skin is warm and dry. No rash noted.  Psychiatric: She has a normal mood and affect.  Nursing note and vitals  reviewed.   ED Course  Procedures (including critical care time) Labs Review Labs Reviewed  CBC WITH DIFFERENTIAL/PLATELET  COMPREHENSIVE METABOLIC PANEL  LIPASE, BLOOD  URINALYSIS, ROUTINE W REFLEX MICROSCOPIC    Imaging Review Ct Abdomen Pelvis W Contrast  10/19/2014   CLINICAL DATA:  Upper abdominal pain. cramping and bloating for 3 months. 30 pound weight gain. Loss of appetite. Gastroesophageal reflux disease. Irritable bowel syndrome.  EXAM: CT ABDOMEN AND PELVIS WITH CONTRAST   TECHNIQUE: Multidetector CT imaging of the abdomen and pelvis was performed using the standard protocol following bolus administration of intravenous contrast.  CONTRAST:  100 cc of Isovue-300  COMPARISON:  None.  FINDINGS: Lower chest: Clear lung bases. Normal heart size without pericardial or pleural effusion.  Hepatobiliary: Normal liver. Normal gallbladder, without biliary ductal dilatation.  Pancreas: Normal, without mass or ductal dilatation.  Spleen: Normal  Adrenals/Urinary Tract: Normal adrenal glands. Normal kidneys, without hydronephrosis. Normal urinary bladder.  Stomach/Bowel: Normal stomach, without wall thickening. The left-sided colon is normal to decompressed in caliber. Normal appendix and terminal ileum. Proximal small bowel loops are normal in caliber. Dilatation of mid to distal small bowel with transition identified at the distal ileum. Example coronal images 46 through 59. No obstructive mass or bowel wall inflammation identified. Small bowel dilatation at up to 5.5 cm.  No small bowel wall thickening to suggest complicating ischemia.  Vascular/Lymphatic: Normal caliber of the aorta and branch vessels. No abdominopelvic adenopathy.  Reproductive: Intrauterine device.  No adnexal mass.  Other: No ascites.  Musculoskeletal: Convex right lumbar spine curvature.  IMPRESSION: 1. Moderate mid and distal small bowel distension with transition identified at the distal ileum. No obstructive cause identified. Depending on patient's symptoms, chronic inflammation as can be seen with Crohn disease could have this appearance. 2. Normal caliber of the proximal most small bowel. This is favored to be related to partial obstruction and lack of propagation proximally. No specific findings to suggest closed loop type obstruction, which should be excluded clinically. These results will be called to the ordering clinician or representative by the Radiology Department at the imaging location.   Electronically  Signed   By: Jeronimo Greaves M.D.   On: 10/19/2014 15:29     EKG Interpretation   Date/Time:  Tuesday October 19 2014 16:46:44 EDT Ventricular Rate:  115 PR Interval:  152 QRS Duration: 76 QT Interval:  319 QTC Calculation: 441 R Axis:   60 Text Interpretation:  Sinus tachycardia Consider left ventricular  hypertrophy Baseline wander in lead(s) II aVF No old tracing to compare  Confirmed by Wentworth Edelen  MD, Dyani Babel [phone]) on 10/19/2014 5:25:43 PM     5:50 PM pain and nausea improved after treatment with intravenous morphine, Reglan and intravenous fluids  7:15 PM patient resting comfortably. Feels much improved after treatment with opioids, intravenous fluids and antiemetics..  Results for orders placed or performed during the hospital encounter of 10/19/14  CBC with Differential  Result Value Ref Range   WBC 8.8 4.0 - 10.5 K/uL   RBC 5.59 (H) 3.87 - 5.11 MIL/uL   Hemoglobin 14.8 12.0 - 15.0 g/dL   HCT 00.1 74.9 - 44.9 %   MCV 77.8 (L) 78.0 - 100.0 fL   MCH 26.5 26.0 - 34.0 pg   MCHC 34.0 30.0 - 36.0 g/dL   RDW 67.5 91.6 - 38.4 %   Platelets 449 (H) 150 - 400 K/uL   Neutrophils Relative % 73 43 - 77 %   Lymphocytes Relative 21  12 - 46 %   Monocytes Relative 6 3 - 12 %   Eosinophils Relative 0 0 - 5 %   Basophils Relative 0 0 - 1 %   Neutro Abs 6.5 1.7 - 7.7 K/uL   Lymphs Abs 1.8 0.7 - 4.0 K/uL   Monocytes Absolute 0.5 0.1 - 1.0 K/uL   Eosinophils Absolute 0.0 0.0 - 0.7 K/uL   Basophils Absolute 0.0 0.0 - 0.1 K/uL   Smear Review      PLATELET CLUMPS NOTED ON SMEAR, COUNT APPEARS ADEQUATE  Comprehensive metabolic panel  Result Value Ref Range   Sodium 133 (L) 135 - 145 mmol/L   Potassium 4.0 3.5 - 5.1 mmol/L   Chloride 94 (L) 96 - 112 mmol/L   CO2 23 19 - 32 mmol/L   Glucose, Bld 136 (H) 70 - 99 mg/dL   BUN 8 6 - 23 mg/dL   Creatinine, Ser 1.61 0.50 - 1.10 mg/dL   Calcium 9.3 8.4 - 09.6 mg/dL   Total Protein 7.3 6.0 - 8.3 g/dL   Albumin 3.5 3.5 - 5.2 g/dL   AST 31 0 - 37  U/L   ALT 25 0 - 35 U/L   Alkaline Phosphatase 128 (H) 39 - 117 U/L   Total Bilirubin 0.6 0.3 - 1.2 mg/dL   GFR calc non Af Amer >90 >90 mL/min   GFR calc Af Amer >90 >90 mL/min   Anion gap 16 (H) 5 - 15  Lipase, blood  Result Value Ref Range   Lipase 19 11 - 59 U/L  I-stat chem 8, ed  Result Value Ref Range   Sodium 133 (L) 135 - 145 mmol/L   Potassium 3.7 3.5 - 5.1 mmol/L   Chloride 98 96 - 112 mmol/L   BUN 8 6 - 23 mg/dL   Creatinine, Ser 0.45 0.50 - 1.10 mg/dL   Glucose, Bld 409 (H) 70 - 99 mg/dL   Calcium, Ion 8.11 (L) 1.12 - 1.23 mmol/L   TCO2 20 0 - 100 mmol/L   Hemoglobin 15.3 (H) 12.0 - 15.0 g/dL   HCT 91.4 78.2 - 95.6 %   Ct Abdomen Pelvis W Contrast  10/19/2014   CLINICAL DATA:  Upper abdominal pain. cramping and bloating for 3 months. 30 pound weight gain. Loss of appetite. Gastroesophageal reflux disease. Irritable bowel syndrome.  EXAM: CT ABDOMEN AND PELVIS WITH CONTRAST  TECHNIQUE: Multidetector CT imaging of the abdomen and pelvis was performed using the standard protocol following bolus administration of intravenous contrast.  CONTRAST:  100 cc of Isovue-300  COMPARISON:  None.  FINDINGS: Lower chest: Clear lung bases. Normal heart size without pericardial or pleural effusion.  Hepatobiliary: Normal liver. Normal gallbladder, without biliary ductal dilatation.  Pancreas: Normal, without mass or ductal dilatation.  Spleen: Normal  Adrenals/Urinary Tract: Normal adrenal glands. Normal kidneys, without hydronephrosis. Normal urinary bladder.  Stomach/Bowel: Normal stomach, without wall thickening. The left-sided colon is normal to decompressed in caliber. Normal appendix and terminal ileum. Proximal small bowel loops are normal in caliber. Dilatation of mid to distal small bowel with transition identified at the distal ileum. Example coronal images 46 through 59. No obstructive mass or bowel wall inflammation identified. Small bowel dilatation at up to 5.5 cm.  No small bowel  wall thickening to suggest complicating ischemia.  Vascular/Lymphatic: Normal caliber of the aorta and branch vessels. No abdominopelvic adenopathy.  Reproductive: Intrauterine device.  No adnexal mass.  Other: No ascites.  Musculoskeletal: Convex right lumbar spine curvature.  IMPRESSION: 1. Moderate mid and distal small bowel distension with transition identified at the distal ileum. No obstructive cause identified. Depending on patient's symptoms, chronic inflammation as can be seen with Crohn disease could have this appearance. 2. Normal caliber of the proximal most small bowel. This is favored to be related to partial obstruction and lack of propagation proximally. No specific findings to suggest closed loop type obstruction, which should be excluded clinically. These results will be called to the ordering clinician or representative by the Radiology Department at the imaging location.   Electronically Signed   By: Jeronimo Greaves M.D.   On: 10/19/2014 15:29    MDM  I spoke with Dr. Janee Morn, from surgery who reviewed the CT scan and does not feel that is a surgical problem. Partial bowel obstruction most likely secondary to inflammatory bowel disease. He suggests admission to medical service and GI consult I spoke with Dr.Newton plan admit medical surgical floor, nothing by mouth, IV fluids, GI consult  I spoke with Dr. Dorena Cookey, gastroenterology service who will consult on case Final diagnoses:  None   Dx #1 parial small bowel obstruction #2 hyperglycemia     Doug Sou, MD 10/19/14 Margretta Ditty  Doug Sou, MD 10/19/14 2018

## 2014-10-19 NOTE — ED Notes (Signed)
Has had ongoing cramping since January and has back and forth to her MD and GI and had a CT scan today showing a bowel obstruction. Vomited 400 ml of contrast upon arrival to the ED. Cramping comes and goes. Pt feels bloated. Last BM was a few days ago

## 2014-10-19 NOTE — ED Notes (Signed)
Pt passed out in triage. Spontaneous respirations. Came to in 30 seconds

## 2014-10-19 NOTE — H&P (Addendum)
Hospitalist Admission History and Physical  Patient name: Shelby Ortiz Medical record number: [id number] Date of birth: 06-05-1981 Age: 34 y.o. Gender: female  Primary Care Provider: No PCP Per Patient  Chief Complaint: abd pain, SBO, ? crohns   History of Present Illness:This is a 34 y.o. year old female with no significant past medical history presenting with abd pain, SBO, ? Chrons. Pt reports recurrent abdominal cramping and pain over the past 2 months. Patient has had secondary to decreased by mouth intake over this same time frame. As well as a noticeable weight loss. Denies any fevers or chills. Abdominal pain is fairly constant. Has also had associated bloating. Has been able to minimally tolerate a bland diet. Has had recurrent episodes of nonbilious nonbloody emesis. Intermittent diarrhea-nonbloody. Last episode was within last week. Patient was referred to gastroenterology for her symptoms. There was initial concern for IBS. Patient was seen by [facility] gastroenterology via Dr. Watt Climes. A CT of abdomen and pelvis was performed today that showed moderate mid and distal small bowel obstruction with transition identified at the distal ileum. Some concern for possible Crohn's disease. Patient was then instructed to go to the ER for further evaluation. On arrival, patient afebrile, heart rate in the 90s to 130s, blood pressure in the 120s to 140s. Had a mild? Presyncopal episode. Symptoms have improved with IV fluids. White blood cell count 8.8, hemoglobin 15.3, creatinine 0.7. Glucose 168. ER physician Dr. Cathleen Fears discussed case with on-call [facility] gastroenterology-Dr. Watt Climes who initially recommended gen surgery consult. Dr. Cathleen Fears discussed case with general surgery who felt patient may benefit from colonoscopy and endoscopy out of concern for possible Crohn's disease. Dr. Cathleen Fears is formally reconsultation [facility] gastroenterology. Waiting on formal phone call back. Patient's symptoms have  markedly improved status post IV fluids, antiemetics.   Assessment and Plan: Shelby Ortiz is a 34 y.o. year old female presenting with abd pain   Active Problems:   Small bowel obstruction   Abdominal pain   1-Abd Pain  -noted SBO on imaging w/ some concern for Crohn's/IBD  -IVFs -NPO  -ESR/CRP -antiemetics -f/u [facility] recs in am (aware of case per EDP)  FEN/GI: NPO  Prophylaxis: sub q heparin  Disposition: pending further evaluation  Code Status:Full Code    Patient Active Problem List   Diagnosis Date Noted  . Small bowel obstruction 10/19/2014  . Abdominal pain 10/19/2014  . IUD (intrauterine device) in place 02/23/2013  . Ocular migraine   . Migraine with aura   . ANXIETY 09/29/2007  . DEPRESSION 09/29/2007  . GERD 09/29/2007  . IRRITABLE BOWEL SYNDROME 09/29/2007  . RUQ PAIN 09/29/2007   Past Medical History: Past Medical History  Diagnosis Date  . Anxiety   . Depression   . Dysmenorrhea   . Ocular migraine   . Migraine with aura   . IUD (intrauterine device) in place 02/2013    skyla    Past Surgical History: Past Surgical History  Procedure Laterality Date  . Wisdom tooth extraction  Age [age]  . Eye surgery  Age 69    Lasix    Social History: History   Social History  . Marital Status: Single    Spouse Name: N/A  . Number of Children: N/A  . Years of Education: N/A   Social History Main Topics  . Smoking status: Never Smoker   . Smokeless tobacco: Never Used  . Alcohol Use: 1.0 oz/week    2 drink(s) per week  . Drug Use: No  .  Sexual Activity:    Partners: Male    Birth Control/ Protection: IUD     Comment: Skyla   Other Topics Concern  . None   Social History Narrative    Family History: Family History  Problem Relation Age of Onset  . Diabetes Father   . Lung cancer Maternal Uncle   . Breast cancer Paternal Aunt   . Breast cancer Maternal Grandmother     Allergies: Allergies  Allergen Reactions  . Sudafed  [Pseudoephedrine Hcl] Anxiety    Shaking     Current Facility-Administered Medications  Medication Dose Route Frequency Provider Last Rate Last Dose  . 0.9 %  sodium chloride infusion   Intravenous Continuous Orlie Dakin, MD      . 0.9 %  sodium chloride infusion   Intravenous Continuous Deneise Lever, MD      . heparin injection 5,000 Units  5,000 Units Subcutaneous 3 times per day Deneise Lever, MD      . morphine 2 MG/ML injection 2-4 mg  2-4 mg Intravenous Q3H PRN Deneise Lever, MD      . ondansetron [facility]) tablet 4 mg  4 mg Oral Q6H PRN Deneise Lever, MD       Or  . ondansetron [facility]) injection 4 mg  4 mg Intravenous Q6H PRN Deneise Lever, MD       Current Outpatient Prescriptions  Medication Sig Dispense Refill  . Aspirin-Acetaminophen-Caffeine (EXCEDRIN PO) Take by mouth as needed.    . Ibuprofen (MOTRIN PO) Take by mouth.    . Levonorgestrel (SKYLA) 13.5 MG IUD by Intrauterine route.    . Multiple Vitamins-Minerals (MULTIVITAMIN PO) Take by mouth.    Marland Kitchen OVER THE COUNTER MEDICATION as needed. Rescue Remedy for anxiety    . promethazine (PHENERGAN) 25 MG suppository Place 1 suppository (25 mg total) rectally every 6 (six) hours as needed for nausea or vomiting. 6 each 0   Review Of Systems: 12 point ROS negative except as noted above in HPI.  Physical Exam: Filed Vitals:   10/19/14 1900  BP: 128/87  Pulse: 90  Temp:   Resp: 23    General: alert and cooperative HEENT: PERRLA and extra ocular movement intact Heart: S1, S2 normal, no murmur, rub or gallop, regular rate and rhythm Lungs: clear to auscultation, no wheezes or rales and unlabored breathing Abdomen:+ bowel sounds, + abd distension, minimal to mild tenderness  Extremities: extremities normal, atraumatic, no cyanosis or edema Skin:no rashes Neurology: normal without focal findings  Labs and Imaging: Lab Results  Component Value Date/Time   NA 133* 10/19/2014 05:27 PM   K 3.7 10/19/2014 05:27  PM   CL 98 10/19/2014 05:27 PM   CO2 23 10/19/2014 04:33 PM   BUN 8 10/19/2014 05:27 PM   CREATININE 0.70 10/19/2014 05:27 PM   GLUCOSE 168* 10/19/2014 05:27 PM   Lab Results  Component Value Date   WBC 8.8 10/19/2014   HGB 15.3* 10/19/2014   HCT 45.0 10/19/2014   MCV 77.8* 10/19/2014   PLT 449* 10/19/2014    Ct Abdomen Pelvis W Contrast  10/19/2014   CLINICAL DATA:  Upper abdominal pain. cramping and bloating for 3 months. 30 pound weight gain. Loss of appetite. Gastroesophageal reflux disease. Irritable bowel syndrome.  EXAM: CT ABDOMEN AND PELVIS WITH CONTRAST  TECHNIQUE: Multidetector CT imaging of the abdomen and pelvis was performed using the standard protocol following bolus administration of intravenous contrast.  CONTRAST:  100 cc of Isovue-300  COMPARISON:  None.  FINDINGS: Lower chest: Clear lung bases. Normal heart size without pericardial or pleural effusion.  Hepatobiliary: Normal liver. Normal gallbladder, without biliary ductal dilatation.  Pancreas: Normal, without mass or ductal dilatation.  Spleen: Normal  Adrenals/Urinary Tract: Normal adrenal glands. Normal kidneys, without hydronephrosis. Normal urinary bladder.  Stomach/Bowel: Normal stomach, without wall thickening. The left-sided colon is normal to decompressed in caliber. Normal appendix and terminal ileum. Proximal small bowel loops are normal in caliber. Dilatation of mid to distal small bowel with transition identified at the distal ileum. Example coronal images 46 through 59. No obstructive mass or bowel wall inflammation identified. Small bowel dilatation at up to 5.5 cm.  No small bowel wall thickening to suggest complicating ischemia.  Vascular/Lymphatic: Normal caliber of the aorta and branch vessels. No abdominopelvic adenopathy.  Reproductive: Intrauterine device.  No adnexal mass.  Other: No ascites.  Musculoskeletal: Convex right lumbar spine curvature.  IMPRESSION: 1. Moderate mid and distal small bowel  distension with transition identified at the distal ileum. No obstructive cause identified. Depending on patient's symptoms, chronic inflammation as can be seen with Crohn disease could have this appearance. 2. Normal caliber of the proximal most small bowel. This is favored to be related to partial obstruction and lack of propagation proximally. No specific findings to suggest closed loop type obstruction, which should be excluded clinically. These results will be called to the ordering clinician or representative by the Radiology Department at the imaging location.   Electronically Signed   By: Abigail Miyamoto M.D.   On: 10/19/2014 15:29           Shanda Howells MD  Pager: [phone]

## 2014-10-20 ENCOUNTER — Encounter (HOSPITAL_COMMUNITY): Payer: Self-pay | Admitting: General Practice

## 2014-10-20 LAB — CBC WITH DIFFERENTIAL/PLATELET
BASOS PCT: 0 % (ref 0–1)
Basophils Absolute: 0 10*3/uL (ref 0.0–0.1)
EOS PCT: 2 % (ref 0–5)
Eosinophils Absolute: 0.1 10*3/uL (ref 0.0–0.7)
HEMATOCRIT: 36.1 % (ref 36.0–46.0)
Hemoglobin: 12 g/dL (ref 12.0–15.0)
LYMPHS PCT: 37 % (ref 12–46)
Lymphs Abs: 2.3 10*3/uL (ref 0.7–4.0)
MCH: 26.1 pg (ref 26.0–34.0)
MCHC: 33.2 g/dL (ref 30.0–36.0)
MCV: 78.6 fL (ref 78.0–100.0)
MONOS PCT: 10 % (ref 3–12)
Monocytes Absolute: 0.6 10*3/uL (ref 0.1–1.0)
NEUTROS ABS: 3.2 10*3/uL (ref 1.7–7.7)
Neutrophils Relative %: 51 % (ref 43–77)
Platelets: ADEQUATE 10*3/uL (ref 150–400)
RBC: 4.59 MIL/uL (ref 3.87–5.11)
RDW: 14.3 % (ref 11.5–15.5)
WBC: 6.2 10*3/uL (ref 4.0–10.5)

## 2014-10-20 LAB — COMPREHENSIVE METABOLIC PANEL
ALBUMIN: 2.9 g/dL — AB (ref 3.5–5.2)
ALT: 23 U/L (ref 0–35)
ANION GAP: 12 (ref 5–15)
AST: 24 U/L (ref 0–37)
Alkaline Phosphatase: 95 U/L (ref 39–117)
BUN: 6 mg/dL (ref 6–23)
CO2: 22 mmol/L (ref 19–32)
CREATININE: 0.66 mg/dL (ref 0.50–1.10)
Calcium: 8.5 mg/dL (ref 8.4–10.5)
Chloride: 102 mmol/L (ref 96–112)
GFR calc non Af Amer: >90 mL/min (ref >90)
GLUCOSE: 90 mg/dL (ref 70–99)
POTASSIUM: 4.5 mmol/L (ref 3.5–5.1)
Sodium: 136 mmol/L (ref 135–145)
TOTAL PROTEIN: 6.1 g/dL (ref 6.0–8.3)
Total Bilirubin: 0.7 mg/dL (ref 0.3–1.2)

## 2014-10-20 LAB — C-REACTIVE PROTEIN: CRP: 3.7 mg/dL — ABNORMAL HIGH (ref <0.60)

## 2014-10-20 LAB — HEMOGLOBIN A1C
Hgb A1c MFr Bld: 5.8 % — ABNORMAL HIGH (ref 4.8–5.6)
MEAN PLASMA GLUCOSE: 120 mg/dL

## 2014-10-20 MED ORDER — SODIUM CHLORIDE 0.9 % IV SOLN
INTRAVENOUS | Status: DC
  Administered 2014-10-21: 125 mL/h via INTRAVENOUS
  Administered 2014-10-22 (×3): via INTRAVENOUS
  Administered 2014-10-23: 1 mL via INTRAVENOUS
  Administered 2014-10-24 – 2014-10-28 (×7): via INTRAVENOUS

## 2014-10-20 MED ORDER — ESCITALOPRAM OXALATE 10 MG PO TABS
10.0000 mg | ORAL_TABLET | Freq: Every day | ORAL | Status: DC
  Administered 2014-10-20 – 2014-10-28 (×9): 10 mg via ORAL
  Filled 2014-10-20 (×9): qty 1

## 2014-10-20 NOTE — Progress Notes (Signed)
[facility] PROGRESS NOTE  Shelby Ortiz LGX:[id number] DOB: 1980/10/20 DOA: 10/19/2014 PCP: No PCP Per Patient  Assessment/Plan: 1. SBO: Admitted to med surg. Remain NPO. Resume IV fluids.  Probably from chronic inflammation.  GI consulted and recommendations given.   Code Status: full code.  Family Communication: none at bedside.  Disposition Plan: remain inpatient.    Consultants:  gastroenterology  Procedures:  CT abdomen and pelvis.   Antibiotics:  none  HPI/Subjective: Not passing flatulence.   Objective: Filed Vitals:   10/20/14 1421  BP: 139/93  Pulse: 91  Temp: 97.9 F (36.6 C)  Resp: 18    Intake/Output Summary (Last 24 hours) at 10/20/14 1822 Last data filed at 10/20/14 1300  Gross per 24 hour  Intake   1000 ml  Output      0 ml  Net   1000 ml   Filed Weights   10/19/14 1618 10/19/14 2240  Weight: 64.864 kg (143 lb) 65.772 kg (145 lb)    Exam:   General:  Alert afebrile comfortable  Cardiovascular: s1s2  Respiratory: ctab  Abdomen: soft NT, distended. BS+  Musculoskeletal: no pedal edema.   Data Reviewed: Basic Metabolic Panel:  Recent Labs Lab 10/19/14 1633 10/19/14 1727 10/20/14 0428  NA 133* 133* 136  K 4.0 3.7 4.5  CL 94* 98 102  CO2 23  --  22  GLUCOSE 136* 168* 90  BUN 8 8 6   CREATININE 0.75 0.70 0.66  CALCIUM 9.3  --  8.5   Liver Function Tests:  Recent Labs Lab 10/19/14 1633 10/20/14 0428  AST 31 24  ALT 25 23  ALKPHOS 128* 95  BILITOT 0.6 0.7  PROT 7.3 6.1  ALBUMIN 3.5 2.9*    Recent Labs Lab 10/19/14 1633  LIPASE 19   No results for input(s): AMMONIA in the last 168 hours. CBC:  Recent Labs Lab 10/19/14 1633 10/19/14 1727 10/20/14 0428  WBC 8.8  --  6.2  NEUTROABS 6.5  --  3.2  HGB 14.8 15.3* 12.0  HCT 43.5 45.0 36.1  MCV 77.8*  --  78.6  PLT 449*  --  PLATELET CLUMPS NOTED ON SMEAR, COUNT APPEARS ADEQUATE   Cardiac Enzymes: No results for input(s): CKTOTAL, CKMB,  CKMBINDEX, TROPONINI in the last 168 hours. BNP (last 3 results) No results for input(s): BNP in the last 8760 hours.  ProBNP (last 3 results) No results for input(s): PROBNP in the last 8760 hours.  CBG: No results for input(s): GLUCAP in the last 168 hours.  No results found for this or any previous visit (from the past 240 hour(s)).   Studies: Ct Abdomen Pelvis W Contrast  10/19/2014   CLINICAL DATA:  Upper abdominal pain. cramping and bloating for 3 months. 30 pound weight gain. Loss of appetite. Gastroesophageal reflux disease. Irritable bowel syndrome.  EXAM: CT ABDOMEN AND PELVIS WITH CONTRAST  TECHNIQUE: Multidetector CT imaging of the abdomen and pelvis was performed using the standard protocol following bolus administration of intravenous contrast.  CONTRAST:  100 cc of Isovue-300  COMPARISON:  None.  FINDINGS: Lower chest: Clear lung bases. Normal heart size without pericardial or pleural effusion.  Hepatobiliary: Normal liver. Normal gallbladder, without biliary ductal dilatation.  Pancreas: Normal, without mass or ductal dilatation.  Spleen: Normal  Adrenals/Urinary Tract: Normal adrenal glands. Normal kidneys, without hydronephrosis. Normal urinary bladder.  Stomach/Bowel: Normal stomach, without wall thickening. The left-sided colon is normal to decompressed in caliber. Normal appendix and terminal ileum. Proximal small bowel loops are  normal in caliber. Dilatation of mid to distal small bowel with transition identified at the distal ileum. Example coronal images 46 through 59. No obstructive mass or bowel wall inflammation identified. Small bowel dilatation at up to 5.5 cm.  No small bowel wall thickening to suggest complicating ischemia.  Vascular/Lymphatic: Normal caliber of the aorta and branch vessels. No abdominopelvic adenopathy.  Reproductive: Intrauterine device.  No adnexal mass.  Other: No ascites.  Musculoskeletal: Convex right lumbar spine curvature.  IMPRESSION: 1. Moderate  mid and distal small bowel distension with transition identified at the distal ileum. No obstructive cause identified. Depending on patient's symptoms, chronic inflammation as can be seen with Crohn disease could have this appearance. 2. Normal caliber of the proximal most small bowel. This is favored to be related to partial obstruction and lack of propagation proximally. No specific findings to suggest closed loop type obstruction, which should be excluded clinically. These results will be called to the ordering clinician or representative by the Radiology Department at the imaging location.   Electronically Signed   By: Jeronimo Greaves M.D.   On: 10/19/2014 15:29    Scheduled Meds: . sodium chloride   Intravenous STAT  . escitalopram  10 mg Oral Daily  . heparin  5,000 Units Subcutaneous 3 times per day   Continuous Infusions: . sodium chloride 125 mL/hr at 10/20/14 1530  . sodium chloride 125 mL/hr at 10/20/14 1610    Active Problems:   Small bowel obstruction   Abdominal pain    Time spent: 25 minutes.     [facility]  [facility] Pager [phone]. If 7PM-7AM, please contact night-coverage at [email or web address], password [facility] 10/20/2014, 6:22 PM  LOS: 1 day

## 2014-10-20 NOTE — Consult Note (Signed)
Referring Provider: Dr. Alvester Morin Primary Care Physician:  No PCP Per Patient Primary Gastroenterologist:  Dr. Ewing Schlein  Reason for Consultation:  Small bowel obstruction; Abdominal pain; Nausea/Vomiting  HPI: Shelby Ortiz is a 34 y.o. female admitted for a small bowel obstruction who reports having 3 months of constant upper quadrant pain (worse periumbilically and in the epigastric region). Reports being diagnosed with irritable bowel syndrome in February at an urgent care and being given Dicyclomine, which helped somewhat with the pain. Developed 2 weeks of watery diarrhea in March that resolved and then began having mainly constipation with occasional diarrhea. This month has been having recurrent nonbilious yellow emesis with continued abdominal cramping. Reports a 30 pound unintentional weight loss. Denies melena or hematochezia. Has never had a colonoscopy. Denies previous abdominal surgeries. CT showed a SBO in the mid to distal small bowel with a transition point in the distal ileum. No surrounding bowel inflammation was seen on CT. Pain has not significantly improved since admit. Husband at bedside. Nurse tech was present during my evaluation.    Past Medical History  Diagnosis Date  . Anxiety   . Depression   . Dysmenorrhea   . Ocular migraine   . Migraine with aura   . IUD (intrauterine device) in place 02/2013    skyla    Past Surgical History  Procedure Laterality Date  . Wisdom tooth extraction  Age [age]  . Eye surgery  Age 50    Lasix    Prior to Admission medications   Medication Sig Start Date End Date Taking? Authorizing Provider  Aspirin-Acetaminophen-Caffeine (EXCEDRIN PO) Take by mouth as needed.   Yes Historical Provider, MD  calcium carbonate (TUMS - DOSED IN MG ELEMENTAL CALCIUM) 500 MG chewable tablet Chew 1 tablet by mouth 2 (two) times daily with a meal. heartburn   Yes Historical Provider, MD  clidinium-chlordiazePOXIDE (LIBRAX) 5-2.5 MG per capsule Take 1  capsule by mouth 4 (four) times daily.   Yes Historical Provider, MD  escitalopram (LEXAPRO) 10 MG tablet Take 10 mg by mouth daily.   Yes Historical Provider, MD  Ibuprofen (MOTRIN PO) Take by mouth.   Yes Historical Provider, MD  Multiple Vitamins-Minerals (MULTIVITAMIN PO) Take by mouth.   Yes Historical Provider, MD  ondansetron (ZOFRAN-ODT) 4 MG disintegrating tablet Take 4 mg by mouth every 8 (eight) hours as needed for nausea or vomiting.   Yes Historical Provider, MD  Levonorgestrel (SKYLA) 13.5 MG IUD by Intrauterine route.    Historical Provider, MD  OVER THE COUNTER MEDICATION as needed. Rescue Remedy for anxiety    Historical Provider, MD    Scheduled Meds: . sodium chloride   Intravenous STAT  . heparin  5,000 Units Subcutaneous 3 times per day   Continuous Infusions: . sodium chloride    . sodium chloride 125 mL/hr at 10/20/14 0631   PRN Meds:.morphine injection, ondansetron **OR** ondansetron (ZOFRAN) IV  Allergies as of 10/19/2014 - Review Complete 10/19/2014  Allergen Reaction Noted  . Sudafed [pseudoephedrine hcl] Anxiety 01/15/2013    Family History  Problem Relation Age of Onset  . Diabetes Father   . Lung cancer Maternal Uncle   . Breast cancer Paternal Aunt   . Breast cancer Maternal Grandmother     History   Social History  . Marital Status: Single    Spouse Name: N/A  . Number of Children: N/A  . Years of Education: N/A   Occupational History  . Not on file.   Social History Main Topics  .  Smoking status: Never Smoker   . Smokeless tobacco: Never Used  . Alcohol Use: 1.0 oz/week    2 drink(s) per week  . Drug Use: No  . Sexual Activity:    Partners: Male    Birth Control/ Protection: IUD     Comment: Skyla   Other Topics Concern  . Not on file   Social History Narrative    Review of Systems: All negative from GI standpoint except as stated above in HPI.  Physical Exam: Vital signs: Filed Vitals:   10/20/14 0610  BP: 126/83   Pulse: 96  Temp: 98.6 F (37 C)  Resp: 20   Last BM Date:  (sometime last week as per pt.) General:   Alert,  Well-developed, well-nourished, pleasant and cooperative in NAD Head: normal Eyes: anicteric; pupils equal and reactive ENT: mouth normal, oropharynx normal Neck: supple, nontender Lungs:  Clear throughout to auscultation.   No wheezes, crackles, or rhonchi. No acute distress. Heart:  Regular rate and rhythm; no murmurs, clicks, rubs,  or gallops. Abdomen: epigastric and periumbilical tenderness with guarding, mild distention, hyperactive bowel sounds  Rectal:  Deferred Neuro: alert, oriented Ext: no edema, pulses intact Skin: warm and dry Psych: normal mood and affect  GI:  Lab Results:  Recent Labs  10/19/14 1633 10/19/14 1727 10/20/14 0428  WBC 8.8  --  6.2  HGB 14.8 15.3* 12.0  HCT 43.5 45.0 36.1  PLT 449*  --  PLATELET CLUMPS NOTED ON SMEAR, COUNT APPEARS ADEQUATE   BMET  Recent Labs  10/19/14 1633 10/19/14 1727 10/20/14 0428  NA 133* 133* 136  K 4.0 3.7 4.5  CL 94* 98 102  CO2 23  --  22  GLUCOSE 136* 168* 90  BUN CREATININE 0.75 0.70 0.66  CALCIUM 9.3  --  8.5   LFT  Recent Labs  10/20/14 0428  PROT 6.1  ALBUMIN 2.9*  AST 24  ALT 23  ALKPHOS 95  BILITOT 0.7   PT/INR No results for input(s): LABPROT, INR in the last 72 hours.   Studies/Results: Ct Abdomen Pelvis W Contrast  10/19/2014   CLINICAL DATA:  Upper abdominal pain. cramping and bloating for 3 months. 30 pound weight gain. Loss of appetite. Gastroesophageal reflux disease. Irritable bowel syndrome.  EXAM: CT ABDOMEN AND PELVIS WITH CONTRAST  TECHNIQUE: Multidetector CT imaging of the abdomen and pelvis was performed using the standard protocol following bolus administration of intravenous contrast.  CONTRAST:  100 cc of Isovue-300  COMPARISON:  None.  FINDINGS: Lower chest: Clear lung bases. Normal heart size without pericardial or pleural effusion.  Hepatobiliary:  Normal liver. Normal gallbladder, without biliary ductal dilatation.  Pancreas: Normal, without mass or ductal dilatation.  Spleen: Normal  Adrenals/Urinary Tract: Normal adrenal glands. Normal kidneys, without hydronephrosis. Normal urinary bladder.  Stomach/Bowel: Normal stomach, without wall thickening. The left-sided colon is normal to decompressed in caliber. Normal appendix and terminal ileum. Proximal small bowel loops are normal in caliber. Dilatation of mid to distal small bowel with transition identified at the distal ileum. Example coronal images 46 through 59. No obstructive mass or bowel wall inflammation identified. Small bowel dilatation at up to 5.5 cm.  No small bowel wall thickening to suggest complicating ischemia.  Vascular/Lymphatic: Normal caliber of the aorta and branch vessels. No abdominopelvic adenopathy.  Reproductive: Intrauterine device.  No adnexal mass.  Other: No ascites.  Musculoskeletal: Convex right lumbar spine curvature.  IMPRESSION: 1. Moderate mid and distal small bowel  distension with transition identified at the distal ileum. No obstructive cause identified. Depending on patient's symptoms, chronic inflammation as can be seen with Crohn disease could have this appearance. 2. Normal caliber of the proximal most small bowel. This is favored to be related to partial obstruction and lack of propagation proximally. No specific findings to suggest closed loop type obstruction, which should be excluded clinically. These results will be called to the ordering clinician or representative by the Radiology Department at the imaging location.   Electronically Signed   By: Jeronimo Greaves M.D.   On: 10/19/2014 15:29    Impression/Plan: 34 yo without any previous bowel surgeries admitted for a partial small bowel obstruction. Source of SBO likely inflammatory in origin. No bowel surgeries to cause adhesions. Chronicity of symptoms goes against infection. Doubt malignancy or ischemia. If  symptoms do not improve with bowel rest (ice chips ok) and IVFs, then may need to give empiric steroids in case this is Crohn's. Will need a colonoscopy after clearance of the SBO (when colon prep can be tolerated) to look for Crohn's in the terminal ileum. Hold off on steroids for now. Do repeat Xray in next 1-2 days if symptoms improve.    LOS: 1 day   Musab Wingard C.  10/20/2014, 12:21 PM

## 2014-10-21 ENCOUNTER — Inpatient Hospital Stay (HOSPITAL_COMMUNITY): Payer: 59

## 2014-10-21 LAB — CBC WITH DIFFERENTIAL/PLATELET
Basophils Absolute: 0 10*3/uL (ref 0.0–0.1)
Basophils Relative: 0 % (ref 0–1)
EOS ABS: 0.1 10*3/uL (ref 0.0–0.7)
Eosinophils Relative: 3 % (ref 0–5)
HEMATOCRIT: 33.9 % — AB (ref 36.0–46.0)
Hemoglobin: 11 g/dL — ABNORMAL LOW (ref 12.0–15.0)
LYMPHS PCT: 44 % (ref 12–46)
Lymphs Abs: 2.1 10*3/uL (ref 0.7–4.0)
MCH: 25.9 pg — AB (ref 26.0–34.0)
MCHC: 32.4 g/dL (ref 30.0–36.0)
MCV: 79.8 fL (ref 78.0–100.0)
Monocytes Absolute: 0.4 10*3/uL (ref 0.1–1.0)
Monocytes Relative: 8 % (ref 3–12)
Neutro Abs: 2.1 10*3/uL (ref 1.7–7.7)
Neutrophils Relative %: 45 % (ref 43–77)
PLATELETS: 246 10*3/uL (ref 150–400)
RBC: 4.25 MIL/uL (ref 3.87–5.11)
RDW: 14.7 % (ref 11.5–15.5)
WBC: 4.7 10*3/uL (ref 4.0–10.5)

## 2014-10-21 LAB — COMPREHENSIVE METABOLIC PANEL
ALBUMIN: 2.5 g/dL — AB (ref 3.5–5.2)
ALT: 18 U/L (ref 0–35)
ANION GAP: 9 (ref 5–15)
AST: 15 U/L (ref 0–37)
Alkaline Phosphatase: 92 U/L (ref 39–117)
BILIRUBIN TOTAL: 0.6 mg/dL (ref 0.3–1.2)
CHLORIDE: 106 mmol/L (ref 96–112)
CO2: 25 mmol/L (ref 19–32)
CREATININE: 0.7 mg/dL (ref 0.50–1.10)
Calcium: 8.4 mg/dL (ref 8.4–10.5)
GLUCOSE: 81 mg/dL (ref 70–99)
Potassium: 3.8 mmol/L (ref 3.5–5.1)
Sodium: 140 mmol/L (ref 135–145)
Total Protein: 5.4 g/dL — ABNORMAL LOW (ref 6.0–8.3)

## 2014-10-21 MED ORDER — BOOST / RESOURCE BREEZE PO LIQD
1.0000 | Freq: Three times a day (TID) | ORAL | Status: DC
  Administered 2014-10-22 – 2014-10-27 (×10): 1 via ORAL

## 2014-10-21 MED ORDER — METHYLPREDNISOLONE SODIUM SUCC 125 MG IJ SOLR
60.0000 mg | Freq: Two times a day (BID) | INTRAMUSCULAR | Status: DC
  Administered 2014-10-21 – 2014-10-28 (×14): 60 mg via INTRAVENOUS
  Filled 2014-10-21 (×14): qty 2

## 2014-10-21 NOTE — Progress Notes (Signed)
Initial Nutrition Assessment  DOCUMENTATION CODES:  Not applicable  INTERVENTION:  Boost Breeze, Other (Comment) (RD to follow for diet advancement)  NUTRITION DIAGNOSIS:  Inadequate oral intake related to inability to eat as evidenced by NPO status.   GOAL:  Patient will meet greater than or equal to 90% of their needs   MONITOR:  Diet advancement, PO intake, Labs, Weight trends, Skin, I & O's  REASON FOR ASSESSMENT:  Malnutrition Screening Tool    ASSESSMENT: Shelby Ortiz is a 34 y.o. female admitted for a small bowel obstruction who reports having 3 months of constant upper quadrant pain (worse periumbilically and in the epigastric region). Reports being diagnosed with irritable bowel syndrome in February at an urgent care and being given Dicyclomine, which helped somewhat with the pain. Developed 2 weeks of watery diarrhea in March that resolved and then began having mainly constipation with occasional diarrhea. This month has been having recurrent nonbilious yellow emesis with continued abdominal cramping. Reports a 30 pound unintentional weight loss. Denies melena or hematochezia. Has never had a colonoscopy. Denies previous abdominal surgeries. CT showed a SBO in the mid to distal small bowel with a transition point in the distal ileum. No surrounding bowel inflammation was seen on CT. Pain has not significantly improved since admit.   Pt admitted with SBO. Hx obtained by pt and mother at bedside. Both confirm poor appetite and weight loss over the past 3-6 months. Pt reports it has been difficult for her get consume adequate nutrition due to abdominal pain and lack of appetite. She is currently not hungry, but reveals that she is not disgusted by the sight of food, which has been an improvement since admission. PTA she was consuming small, frequent meals on a bland diet, due to early satiety. Most frequent foods included oatmeal, saltines, and [place] sandwich on white  bread with cheese and mayonnaise. She reveals that spicy, highly reasoned foods and raw fruits and vegetables triggered symptoms the most. She also shared with this RD that she has had intermittent episodes of abdominal pain and vomiting throughout her adult life, but resolved within a few days and did not notice any patterns with occurences. However, the pain has been constant over the past 3 months PTA. She reveals a UBW of 175#, which she last remembers weighing in August 2015. Pt mother reports she noticed pt was starting to lose weight in November 2015 and estimated pt has lost approximately 9# (6.2%) within in the past month.  Discussed importance of good PO intake to promote healing. Encouraged continued small, frequent meals to optimize PO intake. Pt reports she contemplated trying supplements PTA; amenable to [employer].  Labs reviewed. BUN <5, CBGS WDL.   Height:  Ht Readings from Last 1 Encounters:  10/19/14  (1.549 m)    Weight:  Wt Readings from Last 1 Encounters:  10/19/14 145 lb (65.772 kg)    Ideal Body Weight:  105#  Wt Readings from Last 10 Encounters:  10/19/14 145 lb (65.772 kg)  02/24/14 173 lb (78.472 kg)  04/01/13 174 lb (78.926 kg)  03/02/13 172 lb (78.019 kg)  01/15/13 17 lb (7.711 kg)    BMI:  Body mass index is 27.41 kg/(m^2).  Estimated Nutritional Needs:  Kcal:  1750-1950  Protein:  85-95 grams  Fluid:  1.8-2.0 L  Skin:  Reviewed, no issues  Diet Order:  Diet NPO time specified Except for: Ice Chips, Sips with Meds  EDUCATION NEEDS:  No education needs identified  at this time   Intake/Output Summary (Last 24 hours) at 10/21/14 1024 Last data filed at 10/21/14 0600  Gross per 24 hour  Intake 2747.42 ml  Output      0 ml  Net 2747.42 ml    Last BM:  PTA  Jayvan Mcshan A. Mayford Knife, RD, LDN, CDE Pager: [phone] After hours Pager: [phone]

## 2014-10-21 NOTE — Progress Notes (Addendum)
[facility] PROGRESS NOTE  Shaurice Jozwiak SJG:[id number] DOB: [birth year] DOA: 10/19/2014 PCP: No PCP Per Patient  Assessment/Plan: 1. SBO: Admitted to med surg. Remain NPO. Resume IV fluids.  Probably from chronic inflammation. Plan to empirically start IV steroids and see for clinical improvmeent. Repeat abd film shows persistent sbo. No leukocytosis, and she has been afebrile, doubt infection.  GI consulted and recommendations given.    Mild anemia: continue to monitor.    Code Status: full code.  Family Communication: none at bedside.  Disposition Plan: remain inpatient.    Consultants:  gastroenterology  Procedures:  CT abdomen and pelvis.   Antibiotics:  none  HPI/Subjective: Encouraged to walk more, able to pass some flatulence.   Objective: Filed Vitals:   10/21/14 1345  BP: 128/90  Pulse: 92  Temp: 98.8 F (37.1 C)  Resp: 17    Intake/Output Summary (Last 24 hours) at 10/21/14 1720 Last data filed at 10/21/14 0600  Gross per 24 hour  Intake 2747.42 ml  Output      0 ml  Net 2747.42 ml   Filed Weights   10/19/14 1618 10/19/14 2240  Weight: 64.864 kg (143 lb) 65.772 kg (145 lb)    Exam:   General:  Alert afebrile comfortable  Cardiovascular: s1s2  Respiratory: ctab  Abdomen: soft NT, distended. BS+  Musculoskeletal: no pedal edema.   Data Reviewed: Basic Metabolic Panel:  Recent Labs Lab 10/19/14 1633 10/19/14 1727 10/20/14 0428 10/21/14 0425  NA 133* 133* 136 140  K 4.0 3.7 4.5 3.8  CL 94* 98 102 106  CO2 23  --  22 25  GLUCOSE 136* 168* 90 81  BUN 8 8 6  <5*  CREATININE 0.75 0.70 0.66 0.70  CALCIUM 9.3  --  8.5 8.4   Liver Function Tests:  Recent Labs Lab 10/19/14 1633 10/20/14 0428 10/21/14 0425  AST 31 24 15   ALT 25 23 18   ALKPHOS 128* 95 92  BILITOT 0.6 0.7 0.6  PROT 7.3 6.1 5.4*  ALBUMIN 3.5 2.9* 2.5*    Recent Labs Lab 10/19/14 1633  LIPASE 19   No results for input(s): AMMONIA in the last  168 hours. CBC:  Recent Labs Lab 10/19/14 1633 10/19/14 1727 10/20/14 0428 10/21/14 0425  WBC 8.8  --  6.2 4.7  NEUTROABS 6.5  --  3.2 2.1  HGB 14.8 15.3* 12.0 11.0*  HCT 43.5 45.0 36.1 33.9*  MCV 77.8*  --  78.6 79.8  PLT 449*  --  PLATELET CLUMPS NOTED ON SMEAR, COUNT APPEARS ADEQUATE 246   Cardiac Enzymes: No results for input(s): CKTOTAL, CKMB, CKMBINDEX, TROPONINI in the last 168 hours. BNP (last 3 results) No results for input(s): BNP in the last 8760 hours.  ProBNP (last 3 results) No results for input(s): PROBNP in the last 8760 hours.  CBG: No results for input(s): GLUCAP in the last 168 hours.  No results found for this or any previous visit (from the past 240 hour(s)).   Studies: Dg Abd 2 Views  10/21/2014   CLINICAL DATA:  Small bowel obstruction. Abdominal pain and distention.  EXAM: ABDOMEN - 2 VIEW  COMPARISON:  None.  FINDINGS: There arm multiple dilated loops of small bowel with stairstep air-fluid levels. There is contrast throughout the nondistended colon. IUD in place. No visible free air. Slight lumbar scoliosis with convexity to the right.  IMPRESSION: Findings consistent with small bowel obstruction.   Electronically Signed   By: Francene Boyers M.D.   On: 10/21/2014  12:55    Scheduled Meds: . escitalopram  10 mg Oral Daily  . feeding supplement (RESOURCE BREEZE)  1 Container Oral TID BM  . heparin  5,000 Units Subcutaneous 3 times per day  . methylPREDNISolone (SOLU-MEDROL) injection  60 mg Intravenous Q12H   Continuous Infusions: . sodium chloride 125 mL/hr (10/21/14 0127)    Active Problems:   Small bowel obstruction   Abdominal pain    Time spent: 25 minutes.     [facility]  [facility] Pager [phone]. If 7PM-7AM, please contact night-coverage at [email or web address], password [facility] 10/21/2014, 5:20 PM  LOS: 2 days

## 2014-10-21 NOTE — Progress Notes (Signed)
Patient ID: Shelby Ortiz, female   DOB: [birth year], 34 y.o.   MRN: [id number] [facility] Gastroenterology Progress Note  Manvir Buan 34 y.o. 11/17/1980   Subjective: Tiny amount of gas. No BMs. Abdominal pain about same as yesterday. Denies N/V. Ambulated in halls today. Mother at bedside.  Objective: Vital signs in last 24 hours: Filed Vitals:   10/21/14 1345  BP: 128/90  Pulse: 92  Temp: 98.8 F (37.1 C)  Resp: 17    Physical Exam: Gen: alert, no acute distress CV: RRR Chest: CTA anteriorly Abd: hyperactive bowel sounds, diffuse tenderness with guarding, mild distention  Lab Results:  Recent Labs  10/20/14 0428 10/21/14 0425  NA 136 140  K 4.5 3.8  CL 102 106  CO2 22 25  GLUCOSE 90 81  BUN 6 <5*  CREATININE 0.66 0.70  CALCIUM 8.5 8.4    Recent Labs  10/20/14 0428 10/21/14 0425  AST 24 15  ALT 23 18  ALKPHOS 95 92  BILITOT 0.7 0.6  PROT 6.1 5.4*  ALBUMIN 2.9* 2.5*    Recent Labs  10/20/14 0428 10/21/14 0425  WBC 6.2 4.7  NEUTROABS 3.2 2.1  HGB 12.0 11.0*  HCT 36.1 33.9*  MCV 78.6 79.8  PLT PLATELET CLUMPS NOTED ON SMEAR, COUNT APPEARS ADEQUATE 246   No results for input(s): LABPROT, INR in the last 72 hours.    Assessment/Plan: SBO likely inflammatory possible Crohn's. Doubt infection. Will start empiric IV steroids. Continue bowel rest, IVFs, and supportive care. Will follow.   Herminio Kniskern C. 10/21/2014, 3:18 PM

## 2014-10-21 NOTE — Plan of Care (Signed)
Problem: Phase III Progression Outcomes Goal: Pain controlled on oral analgesia Outcome: Not Met (add Reason) Pt on IV pain medication due to bowel rest

## 2014-10-22 ENCOUNTER — Inpatient Hospital Stay (HOSPITAL_COMMUNITY): Payer: 59

## 2014-10-22 LAB — COMPREHENSIVE METABOLIC PANEL
ALK PHOS: 91 U/L (ref 39–117)
ALT: 15 U/L (ref 0–35)
ANION GAP: 10 (ref 5–15)
AST: 14 U/L (ref 0–37)
Albumin: 2.6 g/dL — ABNORMAL LOW (ref 3.5–5.2)
BUN: <5 mg/dL — ABNORMAL LOW (ref 6–23)
CALCIUM: 8.7 mg/dL (ref 8.4–10.5)
CHLORIDE: 108 mmol/L (ref 96–112)
CO2: 21 mmol/L (ref 19–32)
CREATININE: 0.7 mg/dL (ref 0.50–1.10)
GFR calc Af Amer: >90 mL/min (ref >90)
GFR calc non Af Amer: >90 mL/min (ref >90)
Glucose, Bld: 106 mg/dL — ABNORMAL HIGH (ref 70–99)
Potassium: 4.3 mmol/L (ref 3.5–5.1)
Sodium: 139 mmol/L (ref 135–145)
Total Bilirubin: 0.8 mg/dL (ref 0.3–1.2)
Total Protein: 5.9 g/dL — ABNORMAL LOW (ref 6.0–8.3)

## 2014-10-22 LAB — CBC WITH DIFFERENTIAL/PLATELET
BASOS PCT: 0 % (ref 0–1)
Basophils Absolute: 0 10*3/uL (ref 0.0–0.1)
Eosinophils Absolute: 0 10*3/uL (ref 0.0–0.7)
Eosinophils Relative: 0 % (ref 0–5)
HCT: 34.3 % — ABNORMAL LOW (ref 36.0–46.0)
HEMOGLOBIN: 11.1 g/dL — AB (ref 12.0–15.0)
Lymphocytes Relative: 28 % (ref 12–46)
Lymphs Abs: 1.1 10*3/uL (ref 0.7–4.0)
MCH: 25.5 pg — ABNORMAL LOW (ref 26.0–34.0)
MCHC: 32.4 g/dL (ref 30.0–36.0)
MCV: 78.9 fL (ref 78.0–100.0)
Monocytes Absolute: 0.2 10*3/uL (ref 0.1–1.0)
Monocytes Relative: 4 % (ref 3–12)
Neutro Abs: 2.7 10*3/uL (ref 1.7–7.7)
Neutrophils Relative %: 68 % (ref 43–77)
Platelets: 296 10*3/uL (ref 150–400)
RBC: 4.35 MIL/uL (ref 3.87–5.11)
RDW: 14.7 % (ref 11.5–15.5)
WBC: 4 10*3/uL (ref 4.0–10.5)

## 2014-10-22 NOTE — Progress Notes (Signed)
Patient ID: Shelby Ortiz, female   DOB: May 30, 1981, 35 y.o.   MRN: [id number] [facility] Gastroenterology Progress Note  Shelby Ortiz 34 y.o. 1980-07-12   Subjective: Feels a lot better. No abdominal pain/N/V. Moved a formed stool today and one last night and both were reportedly pale in color. Passing gas. Sitting in bedside chair.  Objective: Vital signs in last 24 hours: Filed Vitals:   10/22/14 0546  BP: 123/77  Pulse: 75  Temp: 97.8 F (36.6 C)  Resp: 19    Physical Exam: Gen: alert, no acute distress CV: RRR Chest: CTA B Abd: soft, nontender, nondistended, +BS  Lab Results:  Recent Labs  10/21/14 0425 10/22/14 0342  NA 140 139  K 3.8 4.3  CL 106 108  CO2 25 21  GLUCOSE 81 106*  BUN <5* <5*  CREATININE 0.70 0.70  CALCIUM 8.4 8.7    Recent Labs  10/21/14 0425 10/22/14 0342  AST 15 14  ALT 18 15  ALKPHOS 92 91  BILITOT 0.6 0.8  PROT 5.4* 5.9*  ALBUMIN 2.5* 2.6*    Recent Labs  10/21/14 0425 10/22/14 0342  WBC 4.7 4.0  NEUTROABS 2.1 2.7  HGB 11.0* 11.1*  HCT 33.9* 34.3*  MCV 79.8 78.9  PLT 246 296   No results for input(s): LABPROT, INR in the last 72 hours.    Assessment/Plan: Small bowel obstruction clinically improving on IV steroids. Slight improvement on Xray but clinically has improved significantly with resolution of abdominal pain and movement of bowels. Continue bowel rest except ice chips and sips of water ok. If tolerates then will change to clear liquid diet tomorrow and slowly advance. Keep on IV steroids until tolerating solid food and then change to PO Prednisone. Will need an outpatient colonoscopy in May/June. Will follow.   Reynard Christoffersen C. 10/22/2014, 2:07 PM

## 2014-10-22 NOTE — Progress Notes (Signed)
[facility] PROGRESS NOTE  Shelby Ortiz ZOX:[id number] DOB: [birth year] DOA: 10/19/2014 PCP: No PCP Per Patient  Assessment/Plan: 1. SBO: Admitted to med surg. Remain NPO. Resume IV fluids.  Probably from chronic inflammation. empirically started IV steroids and see for clinical improvmeent. Repeat abd film shows improvement in sbo. No leukocytosis, and she has been afebrile, doubt infection. Plan for clear liquid diet in am.  GI consulted and recommendations given.    Mild anemia: continue to monitor.    Code Status: full code.  Family Communication: family at bedside.  Disposition Plan: remain inpatient.    Consultants:  gastroenterology  Procedures:  CT abdomen and pelvis.   Antibiotics:  none  HPI/Subjective: Encouraged to walk more, able to pass some flatulence. Had one small bm today.   Objective: Filed Vitals:   10/22/14 1418  BP: 114/85  Pulse: 89  Temp: 97.4 F (36.3 C)  Resp: 16    Intake/Output Summary (Last 24 hours) at 10/22/14 1822 Last data filed at 10/22/14 1500  Gross per 24 hour  Intake   2625 ml  Output      0 ml  Net   2625 ml   Filed Weights   10/19/14 1618 10/19/14 2240  Weight: 64.864 kg (143 lb) 65.772 kg (145 lb)    Exam:   General:  Alert afebrile comfortable  Cardiovascular: s1s2  Respiratory: ctab  Abdomen: soft NT, distended. BS+  Musculoskeletal: no pedal edema.   Data Reviewed: Basic Metabolic Panel:  Recent Labs Lab 10/19/14 1633 10/19/14 1727 10/20/14 0428 10/21/14 0425 10/22/14 0342  NA 133* 133* 136 140 139  K 4.0 3.7 4.5 3.8 4.3  CL 94* 98 102 106 108  CO2 23  --  GLUCOSE 136* 168* 90 81 106*  BUN <5* <5*  CREATININE 0.75 0.70 0.66 0.70 0.70  CALCIUM 9.3  --  8.5 8.4 8.7   Liver Function Tests:  Recent Labs Lab 10/19/14 1633 10/20/14 0428 10/21/14 0425 10/22/14 0342  AST ALT ALKPHOS 128* 95 92 91  BILITOT 0.6 0.7 0.6 0.8  PROT 7.3  6.1 5.4* 5.9*  ALBUMIN 3.5 2.9* 2.5* 2.6*    Recent Labs Lab 10/19/14 1633  LIPASE 19   No results for input(s): AMMONIA in the last 168 hours. CBC:  Recent Labs Lab 10/19/14 1633 10/19/14 1727 10/20/14 0428 10/21/14 0425 10/22/14 0342  WBC 8.8  --  6.2 4.7 4.0  NEUTROABS 6.5  --  3.2 2.1 2.7  HGB 14.8 15.3* 12.0 11.0* 11.1*  HCT 43.5 45.0 36.1 33.9* 34.3*  MCV 77.8*  --  78.6 79.8 78.9  PLT 449*  --  PLATELET CLUMPS NOTED ON SMEAR, COUNT APPEARS ADEQUATE 246 296   Cardiac Enzymes: No results for input(s): CKTOTAL, CKMB, CKMBINDEX, TROPONINI in the last 168 hours. BNP (last 3 results) No results for input(s): BNP in the last 8760 hours.  ProBNP (last 3 results) No results for input(s): PROBNP in the last 8760 hours.  CBG: No results for input(s): GLUCAP in the last 168 hours.  No results found for this or any previous visit (from the past 240 hour(s)).   Studies: Dg Abd 2 Views  10/22/2014   CLINICAL DATA:  Upper abdominal pain. Followup small bowel obstruction.  EXAM: ABDOMEN - 2 VIEW  COMPARISON:  10/21/2014  FINDINGS: Enteric contrast material is identified throughout the colon up to the rectum. Dilated loops of small  bowel and air-fluid levels are again noted. The degree of bowel distention is stable to slightly improved in the interval.  IMPRESSION: 1. Small bowel obstruction pattern is stable to slightly improved in the interval.   Electronically Signed   By: Signa Kell M.D.   On: 10/22/2014 11:40   Dg Abd 2 Views  10/21/2014   CLINICAL DATA:  Small bowel obstruction. Abdominal pain and distention.  EXAM: ABDOMEN - 2 VIEW  COMPARISON:  None.  FINDINGS: There arm multiple dilated loops of small bowel with stairstep air-fluid levels. There is contrast throughout the nondistended colon. IUD in place. No visible free air. Slight lumbar scoliosis with convexity to the right.  IMPRESSION: Findings consistent with small bowel obstruction.   Electronically Signed   By:  Francene Boyers M.D.   On: 10/21/2014 12:55    Scheduled Meds: . escitalopram  10 mg Oral Daily  . feeding supplement (RESOURCE BREEZE)  1 Container Oral TID BM  . heparin  5,000 Units Subcutaneous 3 times per day  . methylPREDNISolone (SOLU-MEDROL) injection  60 mg Intravenous Q12H   Continuous Infusions: . sodium chloride 125 mL/hr at 10/22/14 1046    Active Problems:   Small bowel obstruction   Abdominal pain    Time spent: 25 minutes.     [facility]  [facility] Pager [phone]. If 7PM-7AM, please contact night-coverage at [email or web address], password [facility] 10/22/2014, 6:22 PM  LOS: 3 days

## 2014-10-23 DIAGNOSIS — R109 Unspecified abdominal pain: Secondary | ICD-10-CM

## 2014-10-23 LAB — CBC WITH DIFFERENTIAL/PLATELET
BASOS ABS: 0 10*3/uL (ref 0.0–0.1)
BASOS PCT: 0 % (ref 0–1)
EOS ABS: 0 10*3/uL (ref 0.0–0.7)
EOS PCT: 0 % (ref 0–5)
HCT: 33.5 % — ABNORMAL LOW (ref 36.0–46.0)
Hemoglobin: 10.9 g/dL — ABNORMAL LOW (ref 12.0–15.0)
Lymphocytes Relative: 22 % (ref 12–46)
Lymphs Abs: 1.5 10*3/uL (ref 0.7–4.0)
MCH: 25.3 pg — AB (ref 26.0–34.0)
MCHC: 32.5 g/dL (ref 30.0–36.0)
MCV: 77.9 fL — AB (ref 78.0–100.0)
Monocytes Absolute: 0.5 10*3/uL (ref 0.1–1.0)
Monocytes Relative: 8 % (ref 3–12)
NEUTROS PCT: 70 % (ref 43–77)
Neutro Abs: 4.9 10*3/uL (ref 1.7–7.7)
Platelets: 299 10*3/uL (ref 150–400)
RBC: 4.3 MIL/uL (ref 3.87–5.11)
RDW: 15 % (ref 11.5–15.5)
WBC: 6.9 10*3/uL (ref 4.0–10.5)

## 2014-10-23 LAB — COMPREHENSIVE METABOLIC PANEL
ALT: 12 U/L (ref 0–35)
ANION GAP: 11 (ref 5–15)
AST: 10 U/L (ref 0–37)
Albumin: 2.4 g/dL — ABNORMAL LOW (ref 3.5–5.2)
Alkaline Phosphatase: 79 U/L (ref 39–117)
BILIRUBIN TOTAL: 0.6 mg/dL (ref 0.3–1.2)
BUN: <5 mg/dL — ABNORMAL LOW (ref 6–23)
CHLORIDE: 109 mmol/L (ref 96–112)
CO2: 18 mmol/L — AB (ref 19–32)
CREATININE: 0.79 mg/dL (ref 0.50–1.10)
Calcium: 8.6 mg/dL (ref 8.4–10.5)
GFR calc Af Amer: >90 mL/min (ref >90)
Glucose, Bld: 108 mg/dL — ABNORMAL HIGH (ref 70–99)
POTASSIUM: 3.9 mmol/L (ref 3.5–5.1)
Sodium: 138 mmol/L (ref 135–145)
Total Protein: 5.3 g/dL — ABNORMAL LOW (ref 6.0–8.3)

## 2014-10-23 NOTE — Progress Notes (Signed)
Patient ID: Shelby Ortiz, female   DOB: 09/07/1980, 34 y.o.   MRN: [id number] [facility] Gastroenterology Progress Note  Shelby Ortiz 34 y.o. Jan 24, 1981   Subjective: Sitting in chair. Feels fine. Denies N/V/abdominal pain. Reports passing a smaller formed stool overnight. Hungry this morning. Reports continued ambulation in halls. Mother and nurse in room.  Objective: Vital signs in last 24 hours: Filed Vitals:   10/23/14 0519  BP: 116/80  Pulse: 79  Temp: 97.7 F (36.5 C)  Resp: 17    Physical Exam: Gen: alert, no acute distress, well-nourished CV: RRR Chest: CTA anteriorly Abd: soft, nontender, nondistended, +BS  Lab Results:  Recent Labs  10/22/14 0342 10/23/14 0401  NA 139 138  K 4.3 3.9  CL 108 109  CO2 21 18*  GLUCOSE 106* 108*  BUN <5* <5*  CREATININE 0.70 0.79  CALCIUM 8.7 8.6    Recent Labs  10/22/14 0342 10/23/14 0401  AST 14 10  ALT 15 12  ALKPHOS 91 79  BILITOT 0.8 0.6  PROT 5.9* 5.3*  ALBUMIN 2.6* 2.4*    Recent Labs  10/22/14 0342 10/23/14 0401  WBC 4.0 6.9  NEUTROABS 2.7 4.9  HGB 11.1* 10.9*  HCT 34.3* 33.5*  MCV 78.9 77.9*  PLT 296 299   No results for input(s): LABPROT, INR in the last 72 hours.    Assessment/Plan: 34 yo with resolving small bowel obstruction and suspect etiology is Crohn's disease. Doing better on IV steroids. Will start clear liquids now and if tolerates change later today to full liquids. Recheck Xray tomorrow and if improved further advance diet. Continue IV steroids until tomorrow and then will change to Prednisone 40 mg/day if tolerating diet. Continue ambulation. Will follow.  Shelby Ortiz C. 10/23/2014, 11:34 AM

## 2014-10-23 NOTE — Progress Notes (Signed)
[facility] PROGRESS NOTE  Shelby Ortiz BPZ:[id number] DOB: [birth year] DOA: 10/19/2014 PCP: No PCP Per Patient  Assessment/Plan: 1. SBO: Admitted to med surg.  Resume IV fluids.  Probably from chronic inflammation. empirically started IV steroids and she reports her abd pain and nausea are improved. Repeat abd film shows improvement in sbo. No leukocytosis, and she has been afebrile, doubt infection.  Started her onclearsGI consulted and recommendations given.    Mild anemia: continue to monitor.    Code Status: full code.  Family Communication: family at bedside.  Disposition Plan: remain inpatient.    Consultants:  gastroenterology  Procedures:  CT abdomen and pelvis.   Antibiotics:  none  HPI/Subjective: Encouraged to walk more, able to pass some flatulence. TWO BM last night.   Objective: Filed Vitals:   10/23/14 1455  BP: 124/79  Pulse: 93  Temp: 98 F (36.7 C)  Resp: 18    Intake/Output Summary (Last 24 hours) at 10/23/14 1844 Last data filed at 10/23/14 1400  Gross per 24 hour  Intake 2303.42 ml  Output      0 ml  Net 2303.42 ml   Filed Weights   10/19/14 1618 10/19/14 2240  Weight: 64.864 kg (143 lb) 65.772 kg (145 lb)    Exam:   General:  Alert afebrile comfortable  Cardiovascular: s1s2  Respiratory: ctab  Abdomen: soft NT, distention improved. BS+  Musculoskeletal: no pedal edema.   Data Reviewed: Basic Metabolic Panel:  Recent Labs Lab 10/19/14 1633 10/19/14 1727 10/20/14 0428 10/21/14 0425 10/22/14 0342 10/23/14 0401  NA 133* 133* 136 140 139 138  K 4.0 3.7 4.5 3.8 4.3 3.9  CL 94* 98 102 106 108 109  CO2 23  --  22 25 21  18*  GLUCOSE 136* 168* 90 81 106* 108*  BUN 8 8 6  <5* <5* <5*  CREATININE 0.75 0.70 0.66 0.70 0.70 0.79  CALCIUM 9.3  --  8.5 8.4 8.7 8.6   Liver Function Tests:  Recent Labs Lab 10/19/14 1633 10/20/14 0428 10/21/14 0425 10/22/14 0342 10/23/14 0401  AST 31 24 15 14 10   ALT 25 23 18  15 12   ALKPHOS 128* 95 92 91 79  BILITOT 0.6 0.7 0.6 0.8 0.6  PROT 7.3 6.1 5.4* 5.9* 5.3*  ALBUMIN 3.5 2.9* 2.5* 2.6* 2.4*    Recent Labs Lab 10/19/14 1633  LIPASE 19   No results for input(s): AMMONIA in the last 168 hours. CBC:  Recent Labs Lab 10/19/14 1633 10/19/14 1727 10/20/14 0428 10/21/14 0425 10/22/14 0342 10/23/14 0401  WBC 8.8  --  6.2 4.7 4.0 6.9  NEUTROABS 6.5  --  3.2 2.1 2.7 4.9  HGB 14.8 15.3* 12.0 11.0* 11.1* 10.9*  HCT 43.5 45.0 36.1 33.9* 34.3* 33.5*  MCV 77.8*  --  78.6 79.8 78.9 77.9*  PLT 449*  --  PLATELET CLUMPS NOTED ON SMEAR, COUNT APPEARS ADEQUATE 246 296 299   Cardiac Enzymes: No results for input(s): CKTOTAL, CKMB, CKMBINDEX, TROPONINI in the last 168 hours. BNP (last 3 results) No results for input(s): BNP in the last 8760 hours.  ProBNP (last 3 results) No results for input(s): PROBNP in the last 8760 hours.  CBG: No results for input(s): GLUCAP in the last 168 hours.  No results found for this or any previous visit (from the past 240 hour(s)).   Studies: Dg Abd 2 Views  10/22/2014   CLINICAL DATA:  Upper abdominal pain. Followup small bowel obstruction.  EXAM: ABDOMEN - 2 VIEW  COMPARISON:  10/21/2014  FINDINGS: Enteric contrast material is identified throughout the colon up to the rectum. Dilated loops of small bowel and air-fluid levels are again noted. The degree of bowel distention is stable to slightly improved in the interval.  IMPRESSION: 1. Small bowel obstruction pattern is stable to slightly improved in the interval.   Electronically Signed   By: Signa Kell M.D.   On: 10/22/2014 11:40    Scheduled Meds: . escitalopram  10 mg Oral Daily  . feeding supplement (RESOURCE BREEZE)  1 Container Oral TID BM  . heparin  5,000 Units Subcutaneous 3 times per day  . methylPREDNISolone (SOLU-MEDROL) injection  60 mg Intravenous Q12H   Continuous Infusions: . sodium chloride 75 mL/hr at 10/23/14 1100    Active Problems:    Small bowel obstruction   Abdominal pain    Time spent: 25 minutes.     [facility]  [facility] Pager [phone]. If 7PM-7AM, please contact night-coverage at [email or web address], password [facility] 10/23/2014, 6:44 PM  LOS: 4 days

## 2014-10-24 ENCOUNTER — Inpatient Hospital Stay (HOSPITAL_COMMUNITY): Payer: 59

## 2014-10-24 LAB — COMPREHENSIVE METABOLIC PANEL
ALBUMIN: 2.8 g/dL — AB (ref 3.5–5.0)
ALT: 13 U/L — ABNORMAL LOW (ref 14–54)
ANION GAP: 12 (ref 5–15)
AST: 12 U/L — ABNORMAL LOW (ref 15–41)
Alkaline Phosphatase: 73 U/L (ref 38–126)
BILIRUBIN TOTAL: 0.6 mg/dL (ref 0.3–1.2)
BUN: <5 mg/dL — ABNORMAL LOW (ref 6–20)
CHLORIDE: 107 mmol/L (ref 101–111)
CO2: 21 mmol/L — ABNORMAL LOW (ref 22–32)
CREATININE: 0.66 mg/dL (ref 0.44–1.00)
Calcium: 8.7 mg/dL — ABNORMAL LOW (ref 8.9–10.3)
GFR calc non Af Amer: >60 mL/min (ref >60)
Glucose, Bld: 119 mg/dL — ABNORMAL HIGH (ref 70–99)
Potassium: 3.5 mmol/L (ref 3.5–5.1)
Sodium: 140 mmol/L (ref 135–145)
TOTAL PROTEIN: 5.4 g/dL — AB (ref 6.5–8.1)

## 2014-10-24 LAB — CBC WITH DIFFERENTIAL/PLATELET
Basophils Absolute: 0 10*3/uL (ref 0.0–0.1)
Basophils Relative: 0 % (ref 0–1)
EOS PCT: 0 % (ref 0–5)
Eosinophils Absolute: 0 10*3/uL (ref 0.0–0.7)
HCT: 34.9 % — ABNORMAL LOW (ref 36.0–46.0)
Hemoglobin: 11.6 g/dL — ABNORMAL LOW (ref 12.0–15.0)
Lymphocytes Relative: 20 % (ref 12–46)
Lymphs Abs: 1.5 10*3/uL (ref 0.7–4.0)
MCH: 25.8 pg — ABNORMAL LOW (ref 26.0–34.0)
MCHC: 33.2 g/dL (ref 30.0–36.0)
MCV: 77.6 fL — AB (ref 78.0–100.0)
Monocytes Absolute: 0.6 10*3/uL (ref 0.1–1.0)
Monocytes Relative: 8 % (ref 3–12)
NEUTROS ABS: 5.3 10*3/uL (ref 1.7–7.7)
Neutrophils Relative %: 72 % (ref 43–77)
PLATELETS: 269 10*3/uL (ref 150–400)
RBC: 4.5 MIL/uL (ref 3.87–5.11)
RDW: 15.2 % (ref 11.5–15.5)
WBC: 7.4 10*3/uL (ref 4.0–10.5)

## 2014-10-24 NOTE — Progress Notes (Signed)
[facility] PROGRESS NOTE  Shelby Ortiz WUJ:[id number] DOB: 07-May-1981 DOA: 10/19/2014 PCP: No PCP Per Patient  Assessment/Plan: 1. SBO: Admitted to med surg.  Resume IV fluids.  Probably from chronic inflammation. empirically started IV steroids and she reports her abd pain and nausea are improved. Repeat abd film shows improvement in sbo. No leukocytosis, and she has been afebrile, doubt infection.  Started her on clear liquid diet. She is able to pass flatulence,but no BM today.  GI consulted and recommendations given.    Mild anemia: continue to monitor.    Code Status: full code.  Family Communication: family at bedside.  Disposition Plan: remain inpatient.    Consultants:  gastroenterology  Procedures:  CT abdomen and pelvis.   Antibiotics:  none  HPI/Subjective: Encouraged to walk more, able to pass some flatulence, no  BM last night.   Objective: Filed Vitals:   10/24/14 1559  BP: 143/112  Pulse: 84  Temp:   Resp:     Intake/Output Summary (Last 24 hours) at 10/24/14 1923 Last data filed at 10/24/14 0500  Gross per 24 hour  Intake    360 ml  Output      0 ml  Net    360 ml   Filed Weights   10/19/14 1618 10/19/14 2240  Weight: 64.864 kg (143 lb) 65.772 kg (145 lb)    Exam:   General:  Alert afebrile comfortable  Cardiovascular: s1s2  Respiratory: ctab  Abdomen: soft NT, distention improved. BS+  Musculoskeletal: no pedal edema.   Data Reviewed: Basic Metabolic Panel:  Recent Labs Lab 10/20/14 0428 10/21/14 0425 10/22/14 0342 10/23/14 0401 10/24/14 0521  NA 136 140 139 138 140  K 4.5 3.8 4.3 3.9 3.5  CL 102 106 108 109 107  CO2 18* 21*  GLUCOSE 90 81 106* 108* 119*  BUN 6 <5* <5* <5* <5*  CREATININE 0.66 0.70 0.70 0.79 0.66  CALCIUM 8.5 8.4 8.7 8.6 8.7*   Liver Function Tests:  Recent Labs Lab 10/20/14 0428 10/21/14 0425 10/22/14 0342 10/23/14 0401 10/24/14 0521  AST 12*  ALT 13*  ALKPHOS 95 92 91 79 73  BILITOT 0.7 0.6 0.8 0.6 0.6  PROT 6.1 5.4* 5.9* 5.3* 5.4*  ALBUMIN 2.9* 2.5* 2.6* 2.4* 2.8*    Recent Labs Lab 10/19/14 1633  LIPASE 19   No results for input(s): AMMONIA in the last 168 hours. CBC:  Recent Labs Lab 10/20/14 0428 10/21/14 0425 10/22/14 0342 10/23/14 0401 10/24/14 0521  WBC 6.2 4.7 4.0 6.9 7.4  NEUTROABS 3.2 2.1 2.7 4.9 5.3  HGB 12.0 11.0* 11.1* 10.9* 11.6*  HCT 36.1 33.9* 34.3* 33.5* 34.9*  MCV 78.6 79.8 78.9 77.9* 77.6*  PLT PLATELET CLUMPS NOTED ON SMEAR, COUNT APPEARS ADEQUATE 246 296 299 269   Cardiac Enzymes: No results for input(s): CKTOTAL, CKMB, CKMBINDEX, TROPONINI in the last 168 hours. BNP (last 3 results) No results for input(s): BNP in the last 8760 hours.  ProBNP (last 3 results) No results for input(s): PROBNP in the last 8760 hours.  CBG: No results for input(s): GLUCAP in the last 168 hours.  No results found for this or any previous visit (from the past 240 hour(s)).   Studies: Dg Abd 2 Views  10/24/2014   CLINICAL DATA:  Small bowel obstruction  EXAM: ABDOMEN - 2 VIEW  COMPARISON:  10/22/2014  FINDINGS: Multiple dilated loops of small bowel in the central abdomen, compatible  with small bowel obstruction, unchanged.  Contrast within the ascending and transverse colon, which is not decompressed, unchanged. Descending/sigmoid colon is decompressed and prior contrast has been evacuated.  IUD overlying the pelvis.  Visualized osseous structures are within normal limits.  IMPRESSION: Multiple dilated loops of small bowel in the central abdomen, compatible small bowel obstruction, unchanged.  Residual contrast within the ascending and transverse colon, which is not decompressed, unchanged.   Electronically Signed   By: Charline Bills M.D.   On: 10/24/2014 10:30    Scheduled Meds: . escitalopram  10 mg Oral Daily  . feeding supplement (RESOURCE BREEZE)  1 Container Oral TID BM  . heparin  5,000 Units  Subcutaneous 3 times per day  . methylPREDNISolone (SOLU-MEDROL) injection  60 mg Intravenous Q12H   Continuous Infusions: . sodium chloride 75 mL/hr at 10/24/14 1732    Active Problems:   Small bowel obstruction   Abdominal pain    Time spent: 25 minutes.     [facility]  [facility] Pager [phone]. If 7PM-7AM, please contact night-coverage at [email or web address], password [facility] 10/24/2014, 7:23 PM  LOS: 5 days

## 2014-10-24 NOTE — Progress Notes (Signed)
Patient ID: Shelby Ortiz, female   DOB: 08/01/1980, 34 y.o.   MRN: [id number] [facility] Gastroenterology Progress Note  Shelby Ortiz 34 y.o. June 02, 1981   Subjective: Sitting in chair with mother in room. Reports having recurrent belching after eating tomato soup yesterday. Denies vomiting or abdominal pain. No BMs overnight or today. Less ambulation yesterday.  Objective: Vital signs in last 24 hours: Filed Vitals:   10/24/14 0510  BP: 123/86  Pulse: 63  Temp: 97.8 F (36.6 C)  Resp: 17    Physical Exam: Gen: alert, no acute distress CV: RRR Chest: CTA B Abd: soft, nontender, nondistended, +BS  Lab Results:  Recent Labs  10/23/14 0401 10/24/14 0521  NA 138 140  K 3.9 3.5  CL 109 107  CO2 18* 21*  GLUCOSE 108* 119*  BUN <5* <5*  CREATININE 0.79 0.66  CALCIUM 8.6 8.7*    Recent Labs  10/23/14 0401 10/24/14 0521  AST 10 12*  ALT 12 13*  ALKPHOS 79 73  BILITOT 0.6 0.6  PROT 5.3* 5.4*  ALBUMIN 2.4* 2.8*    Recent Labs  10/23/14 0401 10/24/14 0521  WBC 6.9 7.4  NEUTROABS 4.9 5.3  HGB 10.9* 11.6*  HCT 33.5* 34.9*  MCV 77.9* 77.6*  PLT 299 269   EXAM: ABDOMEN - 2 VIEW  COMPARISON: 10/22/2014  FINDINGS: Multiple dilated loops of small bowel in the central abdomen, compatible with small bowel obstruction, unchanged.  Contrast within the ascending and transverse colon, which is not decompressed, unchanged. Descending/sigmoid colon is decompressed and prior contrast has been evacuated.  IUD overlying the pelvis.  Visualized osseous structures are within normal limits.  IMPRESSION: Multiple dilated loops of small bowel in the central abdomen, compatible small bowel obstruction, unchanged.  Residual contrast within the ascending and transverse colon, which is not decompressed, unchanged.   Electronically Signed  By: Charline Bills M.D.  On: 10/24/2014 10:30    Assessment/Plan: Small bowel obstruction thought to be  inflammatory in origin possibly from Crohn's disease. On empiric IV steroids and although she had BMs 2 days ago she has not had any BMs since then. Xray shows no change compared with the 10/22/14 Xray with a persistent SBO. Will change diet back to clear liquids and continue IV steroids. May need a CT enterography this week to reassess the small bowel. May need a surgical consult if the SBO does not improve. May need an NG tube with bowel rest if vomiting of clear liquids occurs. Continue IVFs. Supportive care. She is rightfully disappointed in the lack of improvement on imaging and lack of BMs yesterday. Encouraged to ambulate. Dr. Merrily Pew will see tomorrow.   Shelby Ortiz C. 10/24/2014, 11:21 AM

## 2014-10-25 ENCOUNTER — Inpatient Hospital Stay (HOSPITAL_COMMUNITY): Payer: 59

## 2014-10-25 LAB — CBC WITH DIFFERENTIAL/PLATELET
Basophils Absolute: 0.1 10*3/uL (ref 0.0–0.1)
Basophils Relative: 1 % (ref 0–1)
EOS ABS: 0 10*3/uL (ref 0.0–0.7)
Eosinophils Relative: 0 % (ref 0–5)
HCT: 31.8 % — ABNORMAL LOW (ref 36.0–46.0)
Hemoglobin: 10.7 g/dL — ABNORMAL LOW (ref 12.0–15.0)
Lymphocytes Relative: 23 % (ref 12–46)
Lymphs Abs: 1.9 10*3/uL (ref 0.7–4.0)
MCH: 26 pg (ref 26.0–34.0)
MCHC: 33.6 g/dL (ref 30.0–36.0)
MCV: 77.2 fL — ABNORMAL LOW (ref 78.0–100.0)
Monocytes Absolute: 0.7 10*3/uL (ref 0.1–1.0)
Monocytes Relative: 9 % (ref 3–12)
NEUTROS PCT: 67 % (ref 43–77)
Neutro Abs: 5.9 10*3/uL (ref 1.7–7.7)
Platelets: 254 10*3/uL (ref 150–400)
RBC: 4.12 MIL/uL (ref 3.87–5.11)
RDW: 15.4 % (ref 11.5–15.5)
WBC: 8.6 10*3/uL (ref 4.0–10.5)

## 2014-10-25 MED ORDER — POLYETHYLENE GLYCOL 3350 17 G PO PACK
17.0000 g | PACK | Freq: Two times a day (BID) | ORAL | Status: DC
  Administered 2014-10-25 – 2014-10-27 (×5): 17 g via ORAL
  Filled 2014-10-25 (×8): qty 1

## 2014-10-25 NOTE — Progress Notes (Signed)
UR COMPLETED  

## 2014-10-25 NOTE — Progress Notes (Signed)
[facility] PROGRESS NOTE  Shelby Ortiz WJX:[id number] DOB: Dec 17, 1980 DOA: 10/19/2014 PCP: No PCP Per Patient  Assessment/Plan: 1. SBO: Admitted to med surg.  Resume IV fluids.  Probably from chronic inflammation. empirically started IV steroids and she reports her abd pain and nausea are improved. Repeat abd film shows slight improvement when compared to the films from admission, but still persistent.  No leukocytosis, and she has been afebrile, doubt infection.  Started her on clear liquid diet. She is able to pass flatulence,but no BM today.  GI consulted and recommendations given. Plan for CT enterography in a day or two. Outpatient colonoscopy when this has resolved.    Mild anemia: continue to monitor.    Code Status: full code.  Family Communication: family at bedside.  Disposition Plan: remain inpatient.    Consultants:  gastroenterology  Procedures:  CT abdomen and pelvis.   Antibiotics:  none  HPI/Subjective: Encouraged to walk more, able to pass some flatulence, no  BM last night.   Objective: Filed Vitals:   10/25/14 1608  BP: 130/82  Pulse: 63  Temp: 98.4 F (36.9 C)  Resp: 16    Intake/Output Summary (Last 24 hours) at 10/25/14 1709 Last data filed at 10/25/14 1200  Gross per 24 hour  Intake   1680 ml  Output      0 ml  Net   1680 ml   Filed Weights   10/19/14 1618 10/19/14 2240  Weight: 64.864 kg (143 lb) 65.772 kg (145 lb)    Exam:   General:  Alert afebrile comfortable  Cardiovascular: s1s2  Respiratory: ctab  Abdomen: soft NT, distention improved. BS+  Musculoskeletal: no pedal edema.   Data Reviewed: Basic Metabolic Panel:  Recent Labs Lab 10/20/14 0428 10/21/14 0425 10/22/14 0342 10/23/14 0401 10/24/14 0521  NA 136 140 139 138 140  K 4.5 3.8 4.3 3.9 3.5  CL 102 106 108 109 107  CO2 18* 21*  GLUCOSE 90 81 106* 108* 119*  BUN 6 <5* <5* <5* <5*  CREATININE 0.66 0.70 0.70 0.79 0.66  CALCIUM 8.5  8.4 8.7 8.6 8.7*   Liver Function Tests:  Recent Labs Lab 10/20/14 0428 10/21/14 0425 10/22/14 0342 10/23/14 0401 10/24/14 0521  AST 12*  ALT 13*  ALKPHOS 95 92 91 79 73  BILITOT 0.7 0.6 0.8 0.6 0.6  PROT 6.1 5.4* 5.9* 5.3* 5.4*  ALBUMIN 2.9* 2.5* 2.6* 2.4* 2.8*    Recent Labs Lab 10/19/14 1633  LIPASE 19   No results for input(s): AMMONIA in the last 168 hours. CBC:  Recent Labs Lab 10/21/14 0425 10/22/14 0342 10/23/14 0401 10/24/14 0521 10/25/14 0400  WBC 4.7 4.0 6.9 7.4 8.6  NEUTROABS 2.1 2.7 4.9 5.3 5.9  HGB 11.0* 11.1* 10.9* 11.6* 10.7*  HCT 33.9* 34.3* 33.5* 34.9* 31.8*  MCV 79.8 78.9 77.9* 77.6* 77.2*  PLT 246 296 299 269 254   Cardiac Enzymes: No results for input(s): CKTOTAL, CKMB, CKMBINDEX, TROPONINI in the last 168 hours. BNP (last 3 results) No results for input(s): BNP in the last 8760 hours.  ProBNP (last 3 results) No results for input(s): PROBNP in the last 8760 hours.  CBG: No results for input(s): GLUCAP in the last 168 hours.  No results found for this or any previous visit (from the past 240 hour(s)).   Studies: Dg Abd 2 Views  10/25/2014   CLINICAL DATA:  Small bowel obstruction, followup  EXAM: ABDOMEN -  2 VIEW  COMPARISON:  10/24/2014  FINDINGS: Oral contrast present within the colon.  Persistent dilatation of small bowel loops compatible with partial small bowel obstruction.  No bowel wall thickening or free intraperitoneal air.  Lung bases clear.  IUD projects over pelvis.  Dextro convex thoracolumbar scoliosis.  IMPRESSION: Persistent partial small bowel obstruction.   Electronically Signed   By: Ulyses Southward M.D.   On: 10/25/2014 10:40   Dg Abd 2 Views  10/24/2014   CLINICAL DATA:  Small bowel obstruction  EXAM: ABDOMEN - 2 VIEW  COMPARISON:  10/22/2014  FINDINGS: Multiple dilated loops of small bowel in the central abdomen, compatible with small bowel obstruction, unchanged.  Contrast within the ascending and  transverse colon, which is not decompressed, unchanged. Descending/sigmoid colon is decompressed and prior contrast has been evacuated.  IUD overlying the pelvis.  Visualized osseous structures are within normal limits.  IMPRESSION: Multiple dilated loops of small bowel in the central abdomen, compatible small bowel obstruction, unchanged.  Residual contrast within the ascending and transverse colon, which is not decompressed, unchanged.   Electronically Signed   By: Charline Bills M.D.   On: 10/24/2014 10:30    Scheduled Meds: . escitalopram  10 mg Oral Daily  . feeding supplement (RESOURCE BREEZE)  1 Container Oral TID BM  . heparin  5,000 Units Subcutaneous 3 times per day  . methylPREDNISolone (SOLU-MEDROL) injection  60 mg Intravenous Q12H  . polyethylene glycol  17 g Oral BID   Continuous Infusions: . sodium chloride 75 mL/hr at 10/25/14 4034    Active Problems:   Small bowel obstruction   Abdominal pain    Time spent: 25 minutes.     [facility]  [facility] Pager [phone]. If 7PM-7AM, please contact night-coverage at [email or web address], password [facility] 10/25/2014, 5:09 PM  LOS: 6 days

## 2014-10-25 NOTE — Progress Notes (Signed)
[facility] PROGRESS NOTE Subjective Pt tolerating clears and passing flatus  Objective: Vital signs in last 24 hours: Temp:  [97.8 F (36.6 C)-98.2 F (36.8 C)] 98.2 F (36.8 C) (05/02 0536) Pulse Rate:  [58-84] 58 (05/02 0536) Resp:  [16-17] 17 (05/02 0536) BP: (110-148)/(78-112) 110/78 mmHg (05/02 0536) SpO2:  [94 %-99 %] 98 % (05/02 0536) Last BM Date: 10/22/14  Intake/Output from previous day: 31-Oct-2022 0701 - 05/02 0700 In: 1800 [I.V.:1800] Out: -  Intake/Output this shift:    PE: General--sitting up eating  - Abdomen--minimal distension, soft   Lab Results:  Recent Labs  10/23/14 0401 31-Oct-2014 0521 10/25/14 0400  WBC 6.9 7.4 8.6  HGB 10.9* 11.6* 10.7*  HCT 33.5* 34.9* 31.8*  PLT 299 269 254   BMET  Recent Labs  10/23/14 0401 Oct 31, 2014 0521  NA 138 140  K 3.9 3.5  CL 109 107  CO2 18* 21*  CREATININE 0.79 0.66   LFT  Recent Labs  10/23/14 0401 Oct 31, 2014 0521  PROT 5.3* 5.4*  AST 10 12*  ALT 12 13*  ALKPHOS 79 73  BILITOT 0.6 0.6   PT/INR No results for input(s): LABPROT, INR in the last 72 hours. PANCREAS No results for input(s): LIPASE in the last 72 hours.       Studies/Results: Dg Abd 2 Views  Oct 31, 2014   CLINICAL DATA:  Small bowel obstruction  EXAM: ABDOMEN - 2 VIEW  COMPARISON:  10/22/2014  FINDINGS: Multiple dilated loops of small bowel in the central abdomen, compatible with small bowel obstruction, unchanged.  Contrast within the ascending and transverse colon, which is not decompressed, unchanged. Descending/sigmoid colon is decompressed and prior contrast has been evacuated.  IUD overlying the pelvis.  Visualized osseous structures are within normal limits.  IMPRESSION: Multiple dilated loops of small bowel in the central abdomen, compatible small bowel obstruction, unchanged.  Residual contrast within the ascending and transverse colon, which is not decompressed, unchanged.   Electronically Signed   By: Charline Bills M.D.   On: 10/31/2014 10:30    Medications: I have reviewed the patient's current medications.  Assessment/Plan: 1. Partial SBO. ? crohns no prior surgeries. Seems to be getting better. Will continue steroids and add Miralax with probable CT enterography in a day or 2  And colon at some point in future. Discussed with pt and Mom.   Yeira Gulden JR,Montford Barg L 10/25/2014, 9:21 AM  Diet:  No Change Anticoagulation: Follow-up Appointment:

## 2014-10-26 ENCOUNTER — Inpatient Hospital Stay (HOSPITAL_COMMUNITY): Payer: 59

## 2014-10-26 ENCOUNTER — Encounter (HOSPITAL_COMMUNITY): Payer: Self-pay | Admitting: Radiology

## 2014-10-26 LAB — CBC WITH DIFFERENTIAL/PLATELET
Basophils Absolute: 0 10*3/uL (ref 0.0–0.1)
Basophils Relative: 0 % (ref 0–1)
EOS ABS: 0 10*3/uL (ref 0.0–0.7)
Eosinophils Relative: 0 % (ref 0–5)
HCT: 34.4 % — ABNORMAL LOW (ref 36.0–46.0)
Hemoglobin: 11.4 g/dL — ABNORMAL LOW (ref 12.0–15.0)
LYMPHS PCT: 16 % (ref 12–46)
Lymphs Abs: 1.2 10*3/uL (ref 0.7–4.0)
MCH: 25.7 pg — ABNORMAL LOW (ref 26.0–34.0)
MCHC: 33.1 g/dL (ref 30.0–36.0)
MCV: 77.7 fL — ABNORMAL LOW (ref 78.0–100.0)
Monocytes Absolute: 0.4 10*3/uL (ref 0.1–1.0)
Monocytes Relative: 5 % (ref 3–12)
NEUTROS ABS: 6 10*3/uL (ref 1.7–7.7)
NEUTROS PCT: 79 % — AB (ref 43–77)
PLATELETS: 242 10*3/uL (ref 150–400)
RBC: 4.43 MIL/uL (ref 3.87–5.11)
RDW: 15.4 % (ref 11.5–15.5)
WBC: 7.6 10*3/uL (ref 4.0–10.5)

## 2014-10-26 LAB — BASIC METABOLIC PANEL
Anion gap: 12 (ref 5–15)
BUN: 5 mg/dL — AB (ref 6–20)
CO2: 25 mmol/L (ref 22–32)
Calcium: 8.7 mg/dL — ABNORMAL LOW (ref 8.9–10.3)
Chloride: 102 mmol/L (ref 101–111)
Creatinine, Ser: 0.6 mg/dL (ref 0.44–1.00)
GFR calc Af Amer: >60 mL/min (ref >60)
GLUCOSE: 126 mg/dL — AB (ref 70–99)
POTASSIUM: 3.9 mmol/L (ref 3.5–5.1)
Sodium: 139 mmol/L (ref 135–145)

## 2014-10-26 MED ORDER — IOHEXOL 300 MG/ML  SOLN
80.0000 mL | Freq: Once | INTRAMUSCULAR | Status: AC | PRN
Start: 2014-10-26 — End: 2014-10-26
  Administered 2014-10-26: 80 mL via INTRAVENOUS

## 2014-10-26 NOTE — Progress Notes (Signed)
[facility] PROGRESS NOTE Subjective Pt feeling better, had small BM  Objective: Vital signs in last 24 hours: Temp:  [97.8 F (36.6 C)-98.5 F (36.9 C)] 98 F (36.7 C) (05/03 0519) Pulse Rate:  [52-78] 52 (05/03 0519) Resp:  [16] 16 (05/03 0519) BP: (121-143)/(82-95) 121/86 mmHg (05/03 0519) SpO2:  [98 %-100 %] 98 % (05/03 0519) Last BM Date: 11/16/2014  Intake/Output from previous day: 2022-11-16 0701 - 05/03 0700 In: 1685 [P.O.:580; I.V.:1105] Out: -  Intake/Output this shift: Total I/O In: 225 [I.V.:225] Out: -   PE: General--NAD  Abdomen--soft nontender  Lab Results:  Recent Labs  10/24/14 0521 11-16-2014 0400 10/26/14 0530  WBC 7.4 8.6 7.6  HGB 11.6* 10.7* 11.4*  HCT 34.9* 31.8* 34.4*  PLT 269 254 242   BMET  Recent Labs  10/24/14 0521 10/26/14 0530  NA 140 139  K 3.5 3.9  CL 107 102  CO2 21* 25  CREATININE 0.66 0.60   LFT  Recent Labs  10/24/14 0521  PROT 5.4*  AST 12*  ALT 13*  ALKPHOS 73  BILITOT 0.6   PT/INR No results for input(s): LABPROT, INR in the last 72 hours. PANCREAS No results for input(s): LIPASE in the last 72 hours.       Studies/Results: Dg Abd 2 Views  2014-11-16   CLINICAL DATA:  Small bowel obstruction, followup  EXAM: ABDOMEN - 2 VIEW  COMPARISON:  10/24/2014  FINDINGS: Oral contrast present within the colon.  Persistent dilatation of small bowel loops compatible with partial small bowel obstruction.  No bowel wall thickening or free intraperitoneal air.  Lung bases clear.  IUD projects over pelvis.  Dextro convex thoracolumbar scoliosis.  IMPRESSION: Persistent partial small bowel obstruction.   Electronically Signed   By: Ulyses Southward M.D.   On: 11/16/2014 10:40    Medications: I have reviewed the patient's current medications.  Assessment/Plan: 1. SBO. Improving ? Crohns for CT enterography today and if ok ? Change to PO meds ? Advance to low residue or full liquid diet.   Tonimarie Gritz JR,Dlisa Barnwell  L 10/26/2014, 12:37 PM  Pager: [phone] If no answer or after hours call [phone]

## 2014-10-26 NOTE — Progress Notes (Signed)
[facility] PROGRESS NOTE  Kilani Joffe UJW:[id number] DOB: 1981/04/01 DOA: 10/19/2014 PCP: No PCP Per Patient Interim summary: 34 y.o. year old lady admitted for abdominal pain , was found to have sbo. Gi consulted and recommendations given.  Assessment/Plan: 1. SBO: Admitted to med surg.  Resume IV fluids.  Probably from chronic inflammation. empirically started IV steroids and she reports her abd pain and nausea are improved. Repeat abd film shows slight improvement when compared to the films from admission, but still persistent.  No leukocytosis, and she has been afebrile, doubt infection.  Started her on clear liquid diet. She is able to pass flatulence, with 1 BM today, will advance to full liquid diet.  GI consulted and recommendations given. CT enterography shows improving sbo. Outpatient colonoscopy when this has resolved.    Mild anemia: continue to monitor.    Code Status: full code.  Family Communication: family at bedside.  Disposition Plan: remain inpatient.    Consultants:  gastroenterology  Procedures:  CT abdomen and pelvis.   Antibiotics:  none  HPI/Subjective: Encouraged to walk more, no nausea, vomiting or abdominal pain.   Objective: Filed Vitals:   10/26/14 0519  BP: 121/86  Pulse: 52  Temp: 98 F (36.7 C)  Resp: 16    Intake/Output Summary (Last 24 hours) at 10/26/14 1529 Last data filed at 10/26/14 0900  Gross per 24 hour  Intake   1430 ml  Output      0 ml  Net   1430 ml   Filed Weights   10/19/14 1618 10/19/14 2240  Weight: 64.864 kg (143 lb) 65.772 kg (145 lb)    Exam:   General:  Alert afebrile comfortable  Cardiovascular: s1s2  Respiratory: ctab  Abdomen: soft NT, distention improved. BS+  Musculoskeletal: no pedal edema.   Data Reviewed: Basic Metabolic Panel:  Recent Labs Lab 10/21/14 0425 10/22/14 0342 10/23/14 0401 10/24/14 0521 10/26/14 0530  NA 140 139 138 140 139  K 3.8 4.3 3.9 3.5 3.9  CL  106 108 109 107 102  CO2 25 21 18* 21* 25  GLUCOSE 81 106* 108* 119* 126*  BUN <5* <5* <5* <5* 5*  CREATININE 0.70 0.70 0.79 0.66 0.60  CALCIUM 8.4 8.7 8.6 8.7* 8.7*   Liver Function Tests:  Recent Labs Lab 10/20/14 0428 10/21/14 0425 10/22/14 0342 10/23/14 0401 10/24/14 0521  AST 12*  ALT 13*  ALKPHOS 95 92 91 79 73  BILITOT 0.7 0.6 0.8 0.6 0.6  PROT 6.1 5.4* 5.9* 5.3* 5.4*  ALBUMIN 2.9* 2.5* 2.6* 2.4* 2.8*    Recent Labs Lab 10/19/14 1633  LIPASE 19   No results for input(s): AMMONIA in the last 168 hours. CBC:  Recent Labs Lab 10/22/14 0342 10/23/14 0401 10/24/14 0521 10/25/14 0400 10/26/14 0530  WBC 4.0 6.9 7.4 8.6 7.6  NEUTROABS 2.7 4.9 5.3 5.9 6.0  HGB 11.1* 10.9* 11.6* 10.7* 11.4*  HCT 34.3* 33.5* 34.9* 31.8* 34.4*  MCV 78.9 77.9* 77.6* 77.2* 77.7*  PLT 296 299 269 254 242   Cardiac Enzymes: No results for input(s): CKTOTAL, CKMB, CKMBINDEX, TROPONINI in the last 168 hours. BNP (last 3 results) No results for input(s): BNP in the last 8760 hours.  ProBNP (last 3 results) No results for input(s): PROBNP in the last 8760 hours.  CBG: No results for input(s): GLUCAP in the last 168 hours.  No results found for this or any previous visit (from the past 240 hour(s)).  Studies: Ct Entero Abd/pelvis W/cm  10/26/2014   CLINICAL DATA:  Follow up small bowel obstruction. History of irritable bowel syndrome. Evaluate for possible Crohn's disease.  EXAM: CT ABDOMEN AND PELVIS WITH CONTRAST (ENTEROGRAPHY)  TECHNIQUE: Multidetector CT of the abdomen and pelvis during bolus administration of intravenous contrast. Negative oral contrast VoLumen was given.  CONTRAST:  80mL OMNIPAQUE IOHEXOL 300 MG/ML  SOLN  COMPARISON:  Radiographs 10/25/2014.  CT 10/19/2014.  FINDINGS: Lower chest: Clear lung bases. No significant pleural or pericardial effusion.  Hepatobiliary: The liver is normal in density without focal abnormality. Small nitrogen  containing gallstones in the gallbladder. No evidence of gallbladder wall thickening or biliary dilatation.  Pancreas: Unremarkable. No pancreatic ductal dilatation or surrounding inflammatory changes.  Spleen: Normal in size without focal abnormality.  Adrenals/Urinary Tract: Both adrenal glands appear normal.The kidneys remain normal in appearance without hydronephrosis. The bladder is mildly distended.  Stomach/Bowel: The stomach and proximal small bowel demonstrate no significant distention. The stomach is filled with the VoLumen. There is persistent moderate dilatation of the mid to distal small bowel, minimally improved from the CT of last week.The terminal ileum is decompressed. No well-defined transition point identified. No focal mucosal lesion or hyper enhancement seen. The appendix appears normal. The positive contrast administered for last week CT has passed into the colon. The colon appears normal in caliber without focal lesion.  Vascular/Lymphatic: There are no enlarged abdominal or pelvic lymph nodes. Small retroperitoneal and mesenteric lymph nodes are unchanged. No significant vascular findings.  Reproductive: Intrauterine device is in place. There is no adnexal mass or pelvic inflammatory process.  Other: No evidence of abdominal wall mass or hernia.  Musculoskeletal: No acute or significant osseous findings. Stable mild convex right scoliosis.  IMPRESSION: 1. Improved but persistent small bowel distention consistent with a partial distal small bowel obstruction. No evidence of acute inflammation or other specific explanation demonstrated. 2. The appendix and colon appear normal. 3. Cholelithiasis.   Electronically Signed   By: Carey Bullocks M.D.   On: 10/26/2014 15:13   Dg Abd 2 Views  10/25/2014   CLINICAL DATA:  Small bowel obstruction, followup  EXAM: ABDOMEN - 2 VIEW  COMPARISON:  10/24/2014  FINDINGS: Oral contrast present within the colon.  Persistent dilatation of small bowel loops  compatible with partial small bowel obstruction.  No bowel wall thickening or free intraperitoneal air.  Lung bases clear.  IUD projects over pelvis.  Dextro convex thoracolumbar scoliosis.  IMPRESSION: Persistent partial small bowel obstruction.   Electronically Signed   By: Ulyses Southward M.D.   On: 10/25/2014 10:40    Scheduled Meds: . escitalopram  10 mg Oral Daily  . feeding supplement (RESOURCE BREEZE)  1 Container Oral TID BM  . heparin  5,000 Units Subcutaneous 3 times per day  . methylPREDNISolone (SOLU-MEDROL) injection  60 mg Intravenous Q12H  . polyethylene glycol  17 g Oral BID   Continuous Infusions: . sodium chloride 75 mL/hr at 10/26/14 0600    Active Problems:   Small bowel obstruction   Abdominal pain    Time spent: 25 minutes.     [facility]  [facility] Pager [phone]. If 7PM-7AM, please contact night-coverage at [email or web address], password [facility] 10/26/2014, 3:29 PM  LOS: 7 days

## 2014-10-27 LAB — CBC WITH DIFFERENTIAL/PLATELET
BASOS ABS: 0 10*3/uL (ref 0.0–0.1)
Basophils Relative: 0 % (ref 0–1)
Eosinophils Absolute: 0 10*3/uL (ref 0.0–0.7)
Eosinophils Relative: 0 % (ref 0–5)
HEMATOCRIT: 40.4 % (ref 36.0–46.0)
Hemoglobin: 13.6 g/dL (ref 12.0–15.0)
Lymphocytes Relative: 24 % (ref 12–46)
Lymphs Abs: 1.9 10*3/uL (ref 0.7–4.0)
MCH: 26.5 pg (ref 26.0–34.0)
MCHC: 33.7 g/dL (ref 30.0–36.0)
MCV: 78.6 fL (ref 78.0–100.0)
MONOS PCT: 7 % (ref 3–12)
Monocytes Absolute: 0.6 10*3/uL (ref 0.1–1.0)
NEUTROS ABS: 5.6 10*3/uL (ref 1.7–7.7)
Neutrophils Relative %: 69 % (ref 43–77)
PLATELETS: DECREASED 10*3/uL (ref 150–400)
RBC: 5.14 MIL/uL — AB (ref 3.87–5.11)
RDW: 16 % — ABNORMAL HIGH (ref 11.5–15.5)
WBC: 8.1 10*3/uL (ref 4.0–10.5)

## 2014-10-27 NOTE — Progress Notes (Signed)
[facility]                                                                              Patient Demographics  Shelby Ortiz, is a 34 y.o. female, DOB - 02-Jul-1980, UEA:[id number]  Admit date - 10/19/2014   Admitting Physician Floydene Flock, MD  Outpatient Primary MD for the patient is No PCP Per Patient  LOS - 8   Chief Complaint  Patient presents with  . Abdominal Pain  . Emesis       Brief HPI  34 year old female with no significant past medical history except IBS presented with abdominal pain. Patient reported recurrent abdominal cramping pain over the past 2 months, decreased oral intake and weight loss. Otherwise denied any fevers or chills. She also reported abdominal pain fairly constant, associated with bloating with recurrent episodes of nonbilious nonbloody emesis, intermittent diarrhea, nonbloody. The patient was referred to gastroenterology for her symptoms. CT abdomen and pelvis showed moderate mid and distal small bowel obstruction with transition identified at distal ileum with some concern for possible Crohn's disease. Patient was then instructed to come to the ER for further evaluation. In ED, HR 90 to 130s and BP 120s to 140s, normal CBC, patient was admitted for further workup.    Assessment & Plan    Active Problems:   Small bowel obstruction - GI consulted, patient was placed on IV fluids, - She was empirically started on IV steroids with concern for possibility of Crohn's and patient reported her abdominal pain had the nausea significantly improved. -Patient underwent CT enterography Which showed persistent dilatation of mid to distal small bowel and distention of stomach, terminal ileum was decompressed consistent with partial SBO in the proximal ileum without clear Crohn's finding. - GI recommended to continue IV steroids today, hopefully transition to oral prednisone tomorrow, keep on full liquid diet for now    Abdominal  pain Improving   Code Status: Full code   Family Communication: Discussed in detail with the patient, all imaging results, lab results explained to the patient and her mother   Disposition Plan: Hopefully tomorrow   Time Spent in minutes  25 minutes   Procedures  CT enterography   Consults   Gastroenterology   DVT Prophylaxis  heparin subcutaneous   Medications  Scheduled Meds: . escitalopram  10 mg Oral Daily  . feeding supplement (RESOURCE BREEZE)  1 Container Oral TID BM  . heparin  5,000 Units Subcutaneous 3 times per day  . methylPREDNISolone (SOLU-MEDROL) injection  60 mg Intravenous Q12H  . polyethylene glycol  17 g Oral BID   Continuous Infusions: . sodium chloride 75 mL/hr at 10/26/14 2110   PRN Meds:.morphine injection, ondansetron **OR** ondansetron (ZOFRAN) IV   Antibiotics   Anti-infectives    None        Subjective:   Shelby Ortiz was seen and examined today. Feels a lot better today, abdominal pain is improving, no nausea, vomiting, fevers or chills. Patient denies dizziness, chest pain, shortness of breath, abdominal pain, new weakness, numbess, tingling. No acute events overnight.    Objective:   Blood pressure 125/92, pulse 65, temperature  98.2 F (36.8 C), temperature source Oral, resp. rate 16, height 5\' 1"  (1.549 m), weight 65.772 kg (145 lb), SpO2 95 %.  Wt Readings from Last 3 Encounters:  10/19/14 65.772 kg (145 lb)  02/24/14 78.472 kg (173 lb)  04/01/13 78.926 kg (174 lb)     Intake/Output Summary (Last 24 hours) at 10/27/14 1358 Last data filed at 10/27/14 0525  Gross per 24 hour  Intake   1625 ml  Output      0 ml  Net   1625 ml    Exam  General: Alert and oriented x 3, NAD  HEENT:  PERRLA, EOMI, Anicteic Sclera, mucous membranes moist.   Neck: Supple, no JVD, no masses  CVS: S1 S2 auscultated, no rubs, murmurs or gallops. Regular rate and rhythm.  Respiratory: Clear to auscultation bilaterally, no wheezing,  rales or rhonchi  Abdomen: Soft, nontender, nondistended, + bowel sounds  Ext: no cyanosis clubbing or edema  Neuro: AAOx3, Cr N's II- XII. Strength 5/5 upper and lower extremities bilaterally  Skin: No rashes  Psych: Normal affect and demeanor, alert and oriented x3    Data Review   Micro Results No results found for this or any previous visit (from the past 240 hour(s)).  Radiology Reports Ct Abdomen Pelvis W Contrast  10/19/2014   CLINICAL DATA:  Upper abdominal pain. cramping and bloating for 3 months. 30 pound weight gain. Loss of appetite. Gastroesophageal reflux disease. Irritable bowel syndrome.  EXAM: CT ABDOMEN AND PELVIS WITH CONTRAST  TECHNIQUE: Multidetector CT imaging of the abdomen and pelvis was performed using the standard protocol following bolus administration of intravenous contrast.  CONTRAST:  100 cc of Isovue-300  COMPARISON:  None.  FINDINGS: Lower chest: Clear lung bases. Normal heart size without pericardial or pleural effusion.  Hepatobiliary: Normal liver. Normal gallbladder, without biliary ductal dilatation.  Pancreas: Normal, without mass or ductal dilatation.  Spleen: Normal  Adrenals/Urinary Tract: Normal adrenal glands. Normal kidneys, without hydronephrosis. Normal urinary bladder.  Stomach/Bowel: Normal stomach, without wall thickening. The left-sided colon is normal to decompressed in caliber. Normal appendix and terminal ileum. Proximal small bowel loops are normal in caliber. Dilatation of mid to distal small bowel with transition identified at the distal ileum. Example coronal images 46 through 59. No obstructive mass or bowel wall inflammation identified. Small bowel dilatation at up to 5.5 cm.  No small bowel wall thickening to suggest complicating ischemia.  Vascular/Lymphatic: Normal caliber of the aorta and branch vessels. No abdominopelvic adenopathy.  Reproductive: Intrauterine device.  No adnexal mass.  Other: No ascites.  Musculoskeletal: Convex  right lumbar spine curvature.  IMPRESSION: 1. Moderate mid and distal small bowel distension with transition identified at the distal ileum. No obstructive cause identified. Depending on patient's symptoms, chronic inflammation as can be seen with Crohn disease could have this appearance. 2. Normal caliber of the proximal most small bowel. This is favored to be related to partial obstruction and lack of propagation proximally. No specific findings to suggest closed loop type obstruction, which should be excluded clinically. These results will be called to the ordering clinician or representative by the Radiology Department at the imaging location.   Electronically Signed   By: Jeronimo Greaves M.D.   On: 10/19/2014 15:29   Ct Entero Abd/pelvis W/cm  10/26/2014   CLINICAL DATA:  Follow up small bowel obstruction. History of irritable bowel syndrome. Evaluate for possible Crohn's disease.  EXAM: CT ABDOMEN AND PELVIS WITH CONTRAST (ENTEROGRAPHY)  TECHNIQUE: Multidetector CT  of the abdomen and pelvis during bolus administration of intravenous contrast. Negative oral contrast VoLumen was given.  CONTRAST:  80mL OMNIPAQUE IOHEXOL 300 MG/ML  SOLN  COMPARISON:  Radiographs 10/25/2014.  CT 10/19/2014.  FINDINGS: Lower chest: Clear lung bases. No significant pleural or pericardial effusion.  Hepatobiliary: The liver is normal in density without focal abnormality. Small nitrogen containing gallstones in the gallbladder. No evidence of gallbladder wall thickening or biliary dilatation.  Pancreas: Unremarkable. No pancreatic ductal dilatation or surrounding inflammatory changes.  Spleen: Normal in size without focal abnormality.  Adrenals/Urinary Tract: Both adrenal glands appear normal.The kidneys remain normal in appearance without hydronephrosis. The bladder is mildly distended.  Stomach/Bowel: The stomach and proximal small bowel demonstrate no significant distention. The stomach is filled with the VoLumen. There is  persistent moderate dilatation of the mid to distal small bowel, minimally improved from the CT of last week.The terminal ileum is decompressed. No well-defined transition point identified. No focal mucosal lesion or hyper enhancement seen. The appendix appears normal. The positive contrast administered for last week CT has passed into the colon. The colon appears normal in caliber without focal lesion.  Vascular/Lymphatic: There are no enlarged abdominal or pelvic lymph nodes. Small retroperitoneal and mesenteric lymph nodes are unchanged. No significant vascular findings.  Reproductive: Intrauterine device is in place. There is no adnexal mass or pelvic inflammatory process.  Other: No evidence of abdominal wall mass or hernia.  Musculoskeletal: No acute or significant osseous findings. Stable mild convex right scoliosis.  IMPRESSION: 1. Improved but persistent small bowel distention consistent with a partial distal small bowel obstruction. No evidence of acute inflammation or other specific explanation demonstrated. 2. The appendix and colon appear normal. 3. Cholelithiasis.   Electronically Signed   By: Carey Bullocks M.D.   On: 10/26/2014 15:13   Dg Abd 2 Views  10/25/2014   CLINICAL DATA:  Small bowel obstruction, followup  EXAM: ABDOMEN - 2 VIEW  COMPARISON:  10/24/2014  FINDINGS: Oral contrast present within the colon.  Persistent dilatation of small bowel loops compatible with partial small bowel obstruction.  No bowel wall thickening or free intraperitoneal air.  Lung bases clear.  IUD projects over pelvis.  Dextro convex thoracolumbar scoliosis.  IMPRESSION: Persistent partial small bowel obstruction.   Electronically Signed   By: Ulyses Southward M.D.   On: 10/25/2014 10:40   Dg Abd 2 Views  10/24/2014   CLINICAL DATA:  Small bowel obstruction  EXAM: ABDOMEN - 2 VIEW  COMPARISON:  10/22/2014  FINDINGS: Multiple dilated loops of small bowel in the central abdomen, compatible with small bowel obstruction,  unchanged.  Contrast within the ascending and transverse colon, which is not decompressed, unchanged. Descending/sigmoid colon is decompressed and prior contrast has been evacuated.  IUD overlying the pelvis.  Visualized osseous structures are within normal limits.  IMPRESSION: Multiple dilated loops of small bowel in the central abdomen, compatible small bowel obstruction, unchanged.  Residual contrast within the ascending and transverse colon, which is not decompressed, unchanged.   Electronically Signed   By: Charline Bills M.D.   On: 10/24/2014 10:30   Dg Abd 2 Views  10/22/2014   CLINICAL DATA:  Upper abdominal pain. Followup small bowel obstruction.  EXAM: ABDOMEN - 2 VIEW  COMPARISON:  10/21/2014  FINDINGS: Enteric contrast material is identified throughout the colon up to the rectum. Dilated loops of small bowel and air-fluid levels are again noted. The degree of bowel distention is stable to slightly improved in the  interval.  IMPRESSION: 1. Small bowel obstruction pattern is stable to slightly improved in the interval.   Electronically Signed   By: Signa Kell M.D.   On: 10/22/2014 11:40   Dg Abd 2 Views  10/21/2014   CLINICAL DATA:  Small bowel obstruction. Abdominal pain and distention.  EXAM: ABDOMEN - 2 VIEW  COMPARISON:  None.  FINDINGS: There arm multiple dilated loops of small bowel with stairstep air-fluid levels. There is contrast throughout the nondistended colon. IUD in place. No visible free air. Slight lumbar scoliosis with convexity to the right.  IMPRESSION: Findings consistent with small bowel obstruction.   Electronically Signed   By: Francene Boyers M.D.   On: 10/21/2014 12:55    CBC  Recent Labs Lab 10/23/14 0401 10/24/14 0521 10/25/14 0400 10/26/14 0530 10/27/14 0411  WBC 6.9 7.4 8.6 7.6 8.1  HGB 10.9* 11.6* 10.7* 11.4* 13.6  HCT 33.5* 34.9* 31.8* 34.4* 40.4  PLT 299 269 254 242 PLATELET CLUMPS NOTED ON SMEAR, COUNT APPEARS DECREASED  MCV 77.9* 77.6* 77.2*  77.7* 78.6  MCH 25.3* 25.8* 26.0 25.7* 26.5  MCHC 32.5 33.2 33.6 33.1 33.7  RDW 15.0 15.2 15.4 15.4 16.0*  LYMPHSABS 1.5 1.5 1.9 1.2 1.9  MONOABS 0.5 0.6 0.7 0.4 0.6  EOSABS 0.0 0.0 0.0 0.0 0.0  BASOSABS 0.0 0.0 0.1 0.0 0.0    Chemistries   Recent Labs Lab 10/21/14 0425 10/22/14 0342 10/23/14 0401 10/24/14 0521 10/26/14 0530  NA 140 139 138 140 139  K 3.8 4.3 3.9 3.5 3.9  CL 106 108 109 107 102  CO2 25 21 18* 21* 25  GLUCOSE 81 106* 108* 119* 126*  BUN <5* <5* <5* <5* 5*  CREATININE 0.70 0.70 0.79 0.66 0.60  CALCIUM 8.4 8.7 8.6 8.7* 8.7*  AST 12*  --   ALT 13*  --   ALKPHOS 92 91 79 73  --   BILITOT 0.6 0.8 0.6 0.6  --    ------------------------------------------------------------------------------------------------------------------ estimated creatinine clearance is 85.2 mL/min (by C-G formula based on Cr of 0.6). ------------------------------------------------------------------------------------------------------------------ No results for input(s): HGBA1C in the last 72 hours. ------------------------------------------------------------------------------------------------------------------ No results for input(s): CHOL, HDL, LDLCALC, TRIG, CHOLHDL, LDLDIRECT in the last 72 hours. ------------------------------------------------------------------------------------------------------------------ No results for input(s): TSH, T4TOTAL, T3FREE, THYROIDAB in the last 72 hours.  Invalid input(s): FREET3 ------------------------------------------------------------------------------------------------------------------ No results for input(s): VITAMINB12, FOLATE, FERRITIN, TIBC, IRON, RETICCTPCT in the last 72 hours.  Coagulation profile No results for input(s): INR, PROTIME in the last 168 hours.  No results for input(s): DDIMER in the last 72 hours.  Cardiac Enzymes No results for input(s): CKMB, TROPONINI, MYOGLOBIN in the last 168 hours.  Invalid  input(s): CK ------------------------------------------------------------------------------------------------------------------ Invalid input(s): POCBNP  No results for input(s): GLUCAP in the last 72 hours.   RAI,RIPUDEEP M.D. [facility] 10/27/2014, 1:58 PM  Pager: [phone]   Between 7am to 7pm - call Pager - [phone]  After 7pm go to [email or web address] - password [email or web address]  Call night coverage person covering after 7pm

## 2014-10-27 NOTE — Progress Notes (Signed)
[facility] PROGRESS NOTE Subjective CT enterography showed persistent dilatation of the mid to distal small bowel and distention of the stomach. The terminal ileum was decompressed consistent with partial SBO in the proximal ileum without clear Crohn's findings. Patient also was found to have some gallstones. She was a bit nausea related after drinking all the contrast for the CT scan but is having bowel movements, passing gas, and tolerating full liquid diet.  Objective: Vital signs in last 24 hours: Temp:  [98.2 F (36.8 C)-98.4 F (36.9 C)] 98.2 F (36.8 C) (05/04 0514) Pulse Rate:  [57-65] 65 (05/04 0514) Resp:  [16-18] 16 (05/04 0514) BP: (125-145)/(76-101) 125/92 mmHg (05/04 0514) SpO2:  [95 %-99 %] 95 % (05/04 0514) Last BM Date: 10/26/14  Intake/Output from previous day: 05/03 0701 - 05/04 0700 In: 1850 [I.V.:1850] Out: -  Intake/Output this shift:    PE: General--no acute distress  Lungs--clear Abdomen-- none distended and generally soft  Lab Results:  Recent Labs  10/25/14 0400 10/26/14 0530 10/27/14 0411  WBC 8.6 7.6 8.1  HGB 10.7* 11.4* 13.6  HCT 31.8* 34.4* 40.4  PLT 254 242 PLATELET CLUMPS NOTED ON SMEAR, COUNT APPEARS DECREASED   BMET  Recent Labs  10/26/14 0530  NA 139  K 3.9  CL 102  CO2 25  CREATININE 0.60   LFT No results for input(s): PROT, AST, ALT, ALKPHOS, BILITOT, BILIDIR, IBILI in the last 72 hours. PT/INR No results for input(s): LABPROT, INR in the last 72 hours. PANCREAS No results for input(s): LIPASE in the last 72 hours.       Studies/Results: Ct Entero Abd/pelvis W/cm  10/26/2014   CLINICAL DATA:  Follow up small bowel obstruction. History of irritable bowel syndrome. Evaluate for possible Crohn's disease.  EXAM: CT ABDOMEN AND PELVIS WITH CONTRAST (ENTEROGRAPHY)  TECHNIQUE: Multidetector CT of the abdomen and pelvis during bolus administration of intravenous contrast. Negative oral contrast VoLumen was  given.  CONTRAST:  80mL OMNIPAQUE IOHEXOL 300 MG/ML  SOLN  COMPARISON:  Radiographs 10/25/2014.  CT 10/19/2014.  FINDINGS: Lower chest: Clear lung bases. No significant pleural or pericardial effusion.  Hepatobiliary: The liver is normal in density without focal abnormality. Small nitrogen containing gallstones in the gallbladder. No evidence of gallbladder wall thickening or biliary dilatation.  Pancreas: Unremarkable. No pancreatic ductal dilatation or surrounding inflammatory changes.  Spleen: Normal in size without focal abnormality.  Adrenals/Urinary Tract: Both adrenal glands appear normal.The kidneys remain normal in appearance without hydronephrosis. The bladder is mildly distended.  Stomach/Bowel: The stomach and proximal small bowel demonstrate no significant distention. The stomach is filled with the VoLumen. There is persistent moderate dilatation of the mid to distal small bowel, minimally improved from the CT of last week.The terminal ileum is decompressed. No well-defined transition point identified. No focal mucosal lesion or hyper enhancement seen. The appendix appears normal. The positive contrast administered for last week CT has passed into the colon. The colon appears normal in caliber without focal lesion.  Vascular/Lymphatic: There are no enlarged abdominal or pelvic lymph nodes. Small retroperitoneal and mesenteric lymph nodes are unchanged. No significant vascular findings.  Reproductive: Intrauterine device is in place. There is no adnexal mass or pelvic inflammatory process.  Other: No evidence of abdominal wall mass or hernia.  Musculoskeletal: No acute or significant osseous findings. Stable mild convex right scoliosis.  IMPRESSION: 1. Improved but persistent small bowel distention consistent with a partial distal small bowel obstruction. No evidence of acute inflammation or other specific explanation  demonstrated. 2. The appendix and colon appear normal. 3. Cholelithiasis.    Electronically Signed   By: Carey Bullocks M.D.   On: 10/26/2014 15:13   Dg Abd 2 Views  10/25/2014   CLINICAL DATA:  Small bowel obstruction, followup  EXAM: ABDOMEN - 2 VIEW  COMPARISON:  10/24/2014  FINDINGS: Oral contrast present within the colon.  Persistent dilatation of small bowel loops compatible with partial small bowel obstruction.  No bowel wall thickening or free intraperitoneal air.  Lung bases clear.  IUD projects over pelvis.  Dextro convex thoracolumbar scoliosis.  IMPRESSION: Persistent partial small bowel obstruction.   Electronically Signed   By: Ulyses Southward M.D.   On: 10/25/2014 10:40    Medications: I have reviewed the patient's current medications.  Assessment/Plan: 1. Distal partial SBO. Felt to be due to new onset of Crohn's disease but do not at this point in time have pathological or clear radiographic criteria for diagnosis. Patient has never had any prior abdominal or pelvic surgery so adhesions are much less likely. She clearly has improved with empiric steroids. She reports that she is 70 to 80% improved with the steroids over her admission symptoms. Will recommend continuing IV steroids in full liquid Now for one more day. Hopefully can change to oral steroids. We'll have the dietitian see the patient for instructions about low residue diet. Hopefully we will be able to send her home oral steroids and follow-up with Dr. Bosie Clos in one week to arrange elective outpatient colonoscopy. Hopefully, we will be able to advance to terminal ileum and obtain pathological diagnosis of Crohn's disease. Currently, neither I nor patient feels she could tolerate colonoscopy prep and would be best served by waiting until her partial SBO further improves.   Kesleigh Morson JR,[place] [facility] 10/27/2014, 9:04 AM  Pager: [phone] If no answer or after hours call [phone]

## 2014-10-27 NOTE — Progress Notes (Signed)
Nutrition Follow-up  DOCUMENTATION CODES:  Not applicable  INTERVENTION:  Boost Breeze, Other (Comment) (RD to follow for diet advancement)  NUTRITION DIAGNOSIS:  Inadequate oral intake related to altered GI function as evidenced by meal completion < 50%.  Ongoing  GOAL:  Patient will meet greater than or equal to 90% of their needs  Progressing  MONITOR:  PO intake, Supplement acceptance, Diet advancement, Labs, Weight trends, Skin, I & O's  REASON FOR ASSESSMENT:  Malnutrition Screening Tool    ASSESSMENT: Shelby Ortiz is a 34 y.o. female admitted for a small bowel obstruction who reports having 3 months of constant upper quadrant pain (worse periumbilically and in the epigastric region). Reports being diagnosed with irritable bowel syndrome in February at an urgent care and being given Dicyclomine, which helped somewhat with the pain. Developed 2 weeks of watery diarrhea in March that resolved and then began having mainly constipation with occasional diarrhea. This month has been having recurrent nonbilious yellow emesis with continued abdominal cramping. Reports a 30 pound unintentional weight loss. Denies melena or hematochezia. Has never had a colonoscopy. Denies previous abdominal surgeries. CT showed a SBO in the mid to distal small bowel with a transition point in the distal ileum. No surrounding bowel inflammation was seen on CT. Pain has not significantly improved since admit.   Per MD notes, CT enterography showed persistent dilatation of the mid to distal small bowel and distention of the stomach. The terminal ileum was decompressed consistent with partial SBO in the proximal ileum without clear Crohn's findings.  Pt reports she is feeling much better and her appetite is slowly improving. She is now on a full liquid diet; she consumed all of her grits this morning. Noted 10-50% meal completion. Pt reports she is consuming Resource Breeze supplements. Offered to  upgrade to Ensure, however, pt would like to continue with [employer]. Discussed where supplements are available in retail settings to purchase for home use.   Received consult for low fiber diet education. RD provided "Fiber Restricted Nutrition Therapy" handout from the Academy of [employer]. Discussed rationale for diet and discussed low fiber foods from each food group as well as high fiber foods to avoid. Encouraged adequate fluid consumption. Discussed specific ways to include more protein into diet.Teach back method used.  Both pt and husband expressed appreciation for visit.   Labs reviewed. BUN: 5, Calcium: 8.7, Glucose: 126.   Height:  Ht Readings from Last 1 Encounters:  10/19/14  (1.549 m)    Weight:  Wt Readings from Last 1 Encounters:  10/19/14 145 lb (65.772 kg)    Ideal Body Weight:  105#  Wt Readings from Last 10 Encounters:  10/19/14 145 lb (65.772 kg)  02/24/14 173 lb (78.472 kg)  04/01/13 174 lb (78.926 kg)  03/02/13 172 lb (78.019 kg)  01/15/13 17 lb (7.711 kg)    BMI:  Body mass index is 27.41 kg/(m^2).  Estimated Nutritional Needs:  Kcal:  1750-1950  Protein:  85-95 grams  Fluid:  1.8-2.0 L  Skin:  Reviewed, no issues  Diet Order:  Diet full liquid Room service appropriate?: Yes; Fluid consistency:: Thin  EDUCATION NEEDS:  Education needs addressed   Intake/Output Summary (Last 24 hours) at 10/27/14 1155 Last data filed at 10/27/14 0525  Gross per 24 hour  Intake   1625 ml  Output      0 ml  Net   1625 ml    Last BM:  10/26/14  Amauris Debois A.  Jimmye Norman, RD, LDN, CDE Pager: [phone] After hours Pager: [phone]

## 2014-10-28 LAB — CBC WITH DIFFERENTIAL/PLATELET
Basophils Absolute: 0 10*3/uL (ref 0.0–0.1)
Basophils Relative: 0 % (ref 0–1)
Eosinophils Absolute: 0 10*3/uL (ref 0.0–0.7)
Eosinophils Relative: 0 % (ref 0–5)
HCT: 35.7 % — ABNORMAL LOW (ref 36.0–46.0)
Hemoglobin: 11.6 g/dL — ABNORMAL LOW (ref 12.0–15.0)
LYMPHS PCT: 27 % (ref 12–46)
Lymphs Abs: 2.1 10*3/uL (ref 0.7–4.0)
MCH: 25.6 pg — ABNORMAL LOW (ref 26.0–34.0)
MCHC: 32.5 g/dL (ref 30.0–36.0)
MCV: 78.6 fL (ref 78.0–100.0)
Monocytes Absolute: 0.5 10*3/uL (ref 0.1–1.0)
Monocytes Relative: 6 % (ref 3–12)
NEUTROS ABS: 5.1 10*3/uL (ref 1.7–7.7)
Neutrophils Relative %: 67 % (ref 43–77)
PLATELETS: 187 10*3/uL (ref 150–400)
RBC: 4.54 MIL/uL (ref 3.87–5.11)
RDW: 15.6 % — ABNORMAL HIGH (ref 11.5–15.5)
WBC: 7.7 10*3/uL (ref 4.0–10.5)

## 2014-10-28 LAB — BASIC METABOLIC PANEL
Anion gap: 8 (ref 5–15)
CO2: 30 mmol/L (ref 22–32)
CREATININE: 0.61 mg/dL (ref 0.44–1.00)
Calcium: 8.8 mg/dL — ABNORMAL LOW (ref 8.9–10.3)
Chloride: 102 mmol/L (ref 101–111)
Glucose, Bld: 98 mg/dL (ref 70–99)
Potassium: 3.6 mmol/L (ref 3.5–5.1)
Sodium: 140 mmol/L (ref 135–145)

## 2014-10-28 MED ORDER — PREDNISONE 20 MG PO TABS
20.0000 mg | ORAL_TABLET | Freq: Two times a day (BID) | ORAL | Status: DC
  Administered 2014-10-28: 20 mg via ORAL
  Filled 2014-10-28: qty 1

## 2014-10-28 MED ORDER — POLYETHYLENE GLYCOL 3350 17 G PO PACK: 17.0000 g | PACK | Freq: Every day | ORAL | Status: DC

## 2014-10-28 MED ORDER — SIMETHICONE 80 MG PO CHEW: 160.0000 mg | CHEWABLE_TABLET | Freq: Four times a day (QID) | ORAL | Status: AC | PRN

## 2014-10-28 MED ORDER — ONDANSETRON 4 MG PO TBDP: 4.0000 mg | ORAL_TABLET | Freq: Four times a day (QID) | ORAL | Status: DC | PRN

## 2014-10-28 MED ORDER — TRAMADOL HCL 50 MG PO TABS: 50.0000 mg | ORAL_TABLET | Freq: Four times a day (QID) | ORAL | Status: DC | PRN

## 2014-10-28 MED ORDER — POLYETHYLENE GLYCOL 3350 17 G PO PACK
17.0000 g | PACK | Freq: Every day | ORAL | Status: DC
  Administered 2014-10-28: 17 g via ORAL
  Filled 2014-10-28: qty 1

## 2014-10-28 MED ORDER — PREDNISONE 20 MG PO TABS: 20.0000 mg | ORAL_TABLET | Freq: Two times a day (BID) | ORAL | Status: DC

## 2014-10-28 MED ORDER — PANTOPRAZOLE SODIUM 40 MG PO TBEC: 40.0000 mg | DELAYED_RELEASE_TABLET | Freq: Every day | ORAL | Status: DC

## 2014-10-28 NOTE — Progress Notes (Signed)
Discharge home. Home discharge instruction given, no questions verbalized. 

## 2014-10-28 NOTE — Progress Notes (Signed)
[facility] PROGRESS NOTE Subjective patient feels much better tolerating liquids having bowel movements and passing gas ambulating in the hall. She has received instructions from dietary  Objective: Vital signs in last 24 hours: Temp:  [97.9 F (36.6 C)-98.7 F (37.1 C)] 98.7 F (37.1 C) (05/05 0558) Pulse Rate:  [58-79] 65 (05/05 0558) Resp:  [15-16] 15 (05/05 0558) BP: (127-143)/(86-97) 127/87 mmHg (05/05 0558) SpO2:  [97 %-99 %] 99 % (05/05 0558) Last BM Date: 10/27/14  Intake/Output from previous day: 05/04 0701 - 05/05 0700 In: 2035 [P.O.:120; I.V.:1915] Out: -  Intake/Output this shift:    PE: General--patient and absolutely no distress  Abdomen-- soft nondistended completely nontender  Lab Results:  Recent Labs  10/26/14 0530 10/27/14 0411 10/28/14 0330  WBC 7.6 8.1 7.7  HGB 11.4* 13.6 11.6*  HCT 34.4* 40.4 35.7*  PLT 242 PLATELET CLUMPS NOTED ON SMEAR, COUNT APPEARS DECREASED 187   BMET  Recent Labs  10/26/14 0530 10/28/14 0330  NA 139 140  K 3.9 3.6  CL 102 102  CO2 25 30  CREATININE 0.60 0.61   LFT No results for input(s): PROT, AST, ALT, ALKPHOS, BILITOT, BILIDIR, IBILI in the last 72 hours. PT/INR No results for input(s): LABPROT, INR in the last 72 hours. PANCREAS No results for input(s): LIPASE in the last 72 hours.       Studies/Results: Ct Entero Abd/pelvis W/cm  10/26/2014   CLINICAL DATA:  Follow up small bowel obstruction. History of irritable bowel syndrome. Evaluate for possible Crohn's disease.  EXAM: CT ABDOMEN AND PELVIS WITH CONTRAST (ENTEROGRAPHY)  TECHNIQUE: Multidetector CT of the abdomen and pelvis during bolus administration of intravenous contrast. Negative oral contrast VoLumen was given.  CONTRAST:  70mL OMNIPAQUE IOHEXOL 300 MG/ML  SOLN  COMPARISON:  Radiographs 10/25/2014.  CT 10/19/2014.  FINDINGS: Lower chest: Clear lung bases. No significant pleural or pericardial effusion.  Hepatobiliary: The liver is  normal in density without focal abnormality. Small nitrogen containing gallstones in the gallbladder. No evidence of gallbladder wall thickening or biliary dilatation.  Pancreas: Unremarkable. No pancreatic ductal dilatation or surrounding inflammatory changes.  Spleen: Normal in size without focal abnormality.  Adrenals/Urinary Tract: Both adrenal glands appear normal.The kidneys remain normal in appearance without hydronephrosis. The bladder is mildly distended.  Stomach/Bowel: The stomach and proximal small bowel demonstrate no significant distention. The stomach is filled with the VoLumen. There is persistent moderate dilatation of the mid to distal small bowel, minimally improved from the CT of last week.The terminal ileum is decompressed. No well-defined transition point identified. No focal mucosal lesion or hyper enhancement seen. The appendix appears normal. The positive contrast administered for last week CT has passed into the colon. The colon appears normal in caliber without focal lesion.  Vascular/Lymphatic: There are no enlarged abdominal or pelvic lymph nodes. Small retroperitoneal and mesenteric lymph nodes are unchanged. No significant vascular findings.  Reproductive: Intrauterine device is in place. There is no adnexal mass or pelvic inflammatory process.  Other: No evidence of abdominal wall mass or hernia.  Musculoskeletal: No acute or significant osseous findings. Stable mild convex right scoliosis.  IMPRESSION: 1. Improved but persistent small bowel distention consistent with a partial distal small bowel obstruction. No evidence of acute inflammation or other specific explanation demonstrated. 2. The appendix and colon appear normal. 3. Cholelithiasis.   Electronically Signed   By: Carey Bullocks M.D.   On: 10/26/2014 15:13    Medications: I have reviewed the patient's current medications.  Assessment/Plan:  1. Partial SBO. Possibly Crohn"s versus other. Long discussion with her about  this. She seems to be responding steroids. I will change her to low residue diet until oral prednisone. If she tolerates this today would send her home on prednisone 20 mg BID and low residue diet. She will follow-up in the office in 1 to 2 weeks with Dr. Bosie Clos to arrange elective colonoscopy. All of this is been discussed in detail with her.   Zadaya Cuadra JR,Jewelianna Pancoast L 10/28/2014, 7:51 AM  Pager: [phone] If no answer or after hours call [phone]

## 2014-10-28 NOTE — Discharge Summary (Addendum)
Physician Discharge Summary   Patient ID: Shelby Ortiz MRN: [id number] DOB/AGE: 1980-08-21 34 y.o.  Admit date: 10/19/2014 Discharge date: 10/28/2014  Primary Care Physician:  No PCP Per Patient  Discharge Diagnoses:    . Small bowel obstruction . Abdominal pain  Consults: Gastroenterology, Dr. Randa Evens   Recommendations for Outpatient Follow-up:  Patient will need screening colonoscopy to rule out Crohn's disease, she will follow-up with Dr. Bosie Clos  TESTS THAT NEED FOLLOW-UP Please check BMET, CBC at the time of follow-up appointment   DIET: Soft, low residue diet    Allergies:   Allergies  Allergen Reactions  . Sudafed [Pseudoephedrine Hcl] Anxiety    Shaking      Discharge Medications:   Medication List    STOP taking these medications        MOTRIN PO     OVER THE COUNTER MEDICATION      TAKE these medications        calcium carbonate 500 MG chewable tablet  Commonly known as:  TUMS - dosed in mg elemental calcium  Chew 1 tablet by mouth 2 (two) times daily with a meal. heartburn     clidinium-chlordiazePOXIDE 5-2.5 MG per capsule  Commonly known as:  LIBRAX  Take 1 capsule by mouth 4 (four) times daily.     escitalopram 10 MG tablet  Commonly known as:  LEXAPRO  Take 10 mg by mouth daily.     EXCEDRIN PO  Take by mouth as needed.     MULTIVITAMIN PO  Take by mouth.     ondansetron 4 MG disintegrating tablet  Commonly known as:  ZOFRAN-ODT  Take 1 tablet (4 mg total) by mouth every 6 (six) hours as needed for nausea or vomiting.     pantoprazole 40 MG tablet  Commonly known as:  PROTONIX  Take 1 tablet (40 mg total) by mouth daily.     polyethylene glycol packet  Commonly known as:  MIRALAX / GLYCOLAX  Take 17 g by mouth daily. Also available over the counter     predniSONE 20 MG tablet  Commonly known as:  DELTASONE  Take 1 tablet (20 mg total) by mouth 2 (two) times daily with a meal.     simethicone 80 MG chewable tablet   Commonly known as:  GAS-X  Chew 2 tablets (160 mg total) by mouth 4 (four) times daily as needed for flatulence (also available over the counter).     SKYLA 13.5 MG Iud  Generic drug:  Levonorgestrel  by Intrauterine route.     traMADol 50 MG tablet  Commonly known as:  ULTRAM  Take 1 tablet (50 mg total) by mouth every 6 (six) hours as needed for moderate pain or severe pain (can take 2 pills if needed for severe pain).         Brief H and P: For complete details please refer to admission H and P, but in brief61 year old female with no significant past medical history except IBS presented with abdominal pain. Patient reported recurrent abdominal cramping pain over the past 2 months, decreased oral intake and weight loss. Otherwise denied any fevers or chills. She also reported abdominal pain fairly constant, associated with bloating with recurrent episodes of nonbilious nonbloody emesis, intermittent diarrhea, nonbloody. The patient was referred to gastroenterology for her symptoms. CT abdomen and pelvis showed moderate mid and distal small bowel obstruction with transition identified at distal ileum with some concern for possible Crohn's disease. Patient was then instructed to come to  the ER for further evaluation. In ED, HR 90 to 130s and BP 120s to 140s, normal CBC, patient was admitted for further workup.  Hospital Course:  Small bowel obstruction Patient was admitted for small bowel obstruction, reported 3 months of constant upper quadrant periumbilical pain. She reported being diagnosed with irritable bowel syndrome in 2/16 at urgent care. Patient reported that she was given dicyclomine which did help somewhat with the pain however developed 2 weeks of watery diarrhea in 3/16 and subsequently mainly constipation and occasional diarrhea. CT of the abdomen showed small bowel obstruction in the mid to distal small bowel with transition point in the distal ileum with no surrounding bowel  inflammation. Gastroenterology was consulted and patient was placed on IV fluids. Due to possible concern for Crohn's or IBD, patient was started apparently on IV steroids which significantly improved her symptoms. Patient underwent CT enterography which showed persistent dilatation of mid to distal small bowel and distention of stomach, terminal ileum was decompressed consistent with partial SBO in the proximal ileum without clear Crohn's finding. Patient tolerated full liquid diet well and was transitioned to solid diet. She was transitioned to oral prednisone, GI recommended 20 mg twice a day until follow-up colonoscopy. Patient will have GI appointment with Dr. Bosie Clos in 1-2 weeks and schedule colonoscopy. The patient was also placed on PPI, antiemetics and tramadol for pain. She was recommended strongly to avoid narcotics.    Abdominal pain Improving   Day of Discharge BP 127/87 mmHg  Pulse 65  Temp(Src) 98.7 F (37.1 C) (Oral)  Resp 15  Ht  (1.549 m)  Wt 65.772 kg (145 lb)  BMI 27.41 kg/m2  SpO2 99%  Physical Exam: General: Alert and awake oriented x3 not in any acute distress. HEENT: anicteric sclera, pupils reactive to light and accommodation CVS: S1-S2 clear no murmur rubs or gallops Chest: clear to auscultation bilaterally, no wheezing rales or rhonchi Abdomen: soft nontender, nondistended, normal bowel sounds Extremities: no cyanosis, clubbing or edema noted bilaterally Neuro: Cranial nerves II-XII intact, no focal neurological deficits   The results of significant diagnostics from this hospitalization (including imaging, microbiology, ancillary and laboratory) are listed below for reference.    LAB RESULTS: Basic Metabolic Panel:  Recent Labs Lab 10/26/14 0530 10/28/14 0330  NA 139 140  K 3.9 3.6  CL 102 102  CO2 25 30  GLUCOSE 126* 98  BUN 5* <5*  CREATININE 0.60 0.61  CALCIUM 8.7* 8.8*   Liver Function Tests:  Recent Labs Lab 10/23/14 0401  10/24/14 0521  AST 10 12*  ALT 12 13*  ALKPHOS 79 73  BILITOT 0.6 0.6  PROT 5.3* 5.4*  ALBUMIN 2.4* 2.8*   No results for input(s): LIPASE, AMYLASE in the last 168 hours. No results for input(s): AMMONIA in the last 168 hours. CBC:  Recent Labs Lab 10/27/14 0411 10/28/14 0330  WBC 8.1 7.7  NEUTROABS 5.6 5.1  HGB 13.6 11.6*  HCT 40.4 35.7*  MCV 78.6 78.6  PLT PLATELET CLUMPS NOTED ON SMEAR, COUNT APPEARS DECREASED 187   Cardiac Enzymes: No results for input(s): CKTOTAL, CKMB, CKMBINDEX, TROPONINI in the last 168 hours. BNP: Invalid input(s): POCBNP CBG: No results for input(s): GLUCAP in the last 168 hours.  Significant Diagnostic Studies:  Ct Abdomen Pelvis W Contrast  10/19/2014   CLINICAL DATA:  Upper abdominal pain. cramping and bloating for 3 months. 30 pound weight gain. Loss of appetite. Gastroesophageal reflux disease. Irritable bowel syndrome.  EXAM: CT ABDOMEN AND  PELVIS WITH CONTRAST  TECHNIQUE: Multidetector CT imaging of the abdomen and pelvis was performed using the standard protocol following bolus administration of intravenous contrast.  CONTRAST:  100 cc of Isovue-300  COMPARISON:  None.  FINDINGS: Lower chest: Clear lung bases. Normal heart size without pericardial or pleural effusion.  Hepatobiliary: Normal liver. Normal gallbladder, without biliary ductal dilatation.  Pancreas: Normal, without mass or ductal dilatation.  Spleen: Normal  Adrenals/Urinary Tract: Normal adrenal glands. Normal kidneys, without hydronephrosis. Normal urinary bladder.  Stomach/Bowel: Normal stomach, without wall thickening. The left-sided colon is normal to decompressed in caliber. Normal appendix and terminal ileum. Proximal small bowel loops are normal in caliber. Dilatation of mid to distal small bowel with transition identified at the distal ileum. Example coronal images 46 through 59. No obstructive mass or bowel wall inflammation identified. Small bowel dilatation at up to 5.5 cm.   No small bowel wall thickening to suggest complicating ischemia.  Vascular/Lymphatic: Normal caliber of the aorta and branch vessels. No abdominopelvic adenopathy.  Reproductive: Intrauterine device.  No adnexal mass.  Other: No ascites.  Musculoskeletal: Convex right lumbar spine curvature.  IMPRESSION: 1. Moderate mid and distal small bowel distension with transition identified at the distal ileum. No obstructive cause identified. Depending on patient's symptoms, chronic inflammation as can be seen with Crohn disease could have this appearance. 2. Normal caliber of the proximal most small bowel. This is favored to be related to partial obstruction and lack of propagation proximally. No specific findings to suggest closed loop type obstruction, which should be excluded clinically. These results will be called to the ordering clinician or representative by the Radiology Department at the imaging location.   Electronically Signed   By: Jeronimo Greaves M.D.   On: 10/19/2014 15:29    2D ECHO:   Disposition and Follow-up:     Discharge Instructions    Discharge instructions    Complete by:  As directed   DIET: SOFT, low residue diet. Please make an appt with Dr Bosie Clos (GI) in 1-2 weeks, you will need colonoscopy     Increase activity slowly    Complete by:  As directed             DISPOSITION: Home   DISCHARGE FOLLOW-UP Follow-up Information    Follow up with SCHOOLER,VINCENT C., MD. Schedule an appointment as soon as possible for a visit in 2 weeks.   Specialty:  Gastroenterology   Why:  for hospital follow-up   Contact information:   [address] [place] [address] [phone]        Time spent on Discharge: 35 minutes  Signed:   RAI,RIPUDEEP M.D. [facility] 10/28/2014, 12:38 PM Pager: [phone]

## 2014-12-02 ENCOUNTER — Emergency Department (HOSPITAL_COMMUNITY)
Admission: EM | Admit: 2014-12-02 | Discharge: 2014-12-02 | Disposition: A | Discharge status: Home / Self Care | Payer: 59 | Attending: Emergency Medicine | Admitting: Emergency Medicine

## 2014-12-02 ENCOUNTER — Encounter (HOSPITAL_COMMUNITY): Payer: Self-pay | Admitting: Emergency Medicine

## 2014-12-02 DIAGNOSIS — Z975 Presence of (intrauterine) contraceptive device: Secondary | ICD-10-CM | POA: Insufficient documentation

## 2014-12-02 DIAGNOSIS — G43109 Migraine with aura, not intractable, without status migrainosus: Secondary | ICD-10-CM | POA: Insufficient documentation

## 2014-12-02 DIAGNOSIS — Z8719 Personal history of other diseases of the digestive system: Secondary | ICD-10-CM | POA: Insufficient documentation

## 2014-12-02 DIAGNOSIS — Z8742 Personal history of other diseases of the female genital tract: Secondary | ICD-10-CM | POA: Diagnosis not present

## 2014-12-02 DIAGNOSIS — Z79899 Other long term (current) drug therapy: Secondary | ICD-10-CM | POA: Diagnosis not present

## 2014-12-02 DIAGNOSIS — Z7952 Long term (current) use of systemic steroids: Secondary | ICD-10-CM | POA: Insufficient documentation

## 2014-12-02 DIAGNOSIS — Z3202 Encounter for pregnancy test, result negative: Secondary | ICD-10-CM | POA: Insufficient documentation

## 2014-12-02 DIAGNOSIS — F419 Anxiety disorder, unspecified: Secondary | ICD-10-CM | POA: Insufficient documentation

## 2014-12-02 DIAGNOSIS — E876 Hypokalemia: Secondary | ICD-10-CM | POA: Diagnosis not present

## 2014-12-02 DIAGNOSIS — F329 Major depressive disorder, single episode, unspecified: Secondary | ICD-10-CM | POA: Diagnosis not present

## 2014-12-02 DIAGNOSIS — I951 Orthostatic hypotension: Secondary | ICD-10-CM

## 2014-12-02 DIAGNOSIS — I959 Hypotension, unspecified: Secondary | ICD-10-CM | POA: Diagnosis present

## 2014-12-02 LAB — BASIC METABOLIC PANEL
Anion gap: 13 (ref 5–15)
BUN: 15 mg/dL (ref 6–20)
CO2: 27 mmol/L (ref 22–32)
Calcium: 8.8 mg/dL — ABNORMAL LOW (ref 8.9–10.3)
Chloride: 96 mmol/L — ABNORMAL LOW (ref 101–111)
Creatinine, Ser: 0.8 mg/dL (ref 0.44–1.00)
GFR calc Af Amer: >60 mL/min (ref >60)
GLUCOSE: 160 mg/dL — AB (ref 65–99)
Potassium: 3.3 mmol/L — ABNORMAL LOW (ref 3.5–5.1)
Sodium: 136 mmol/L (ref 135–145)

## 2014-12-02 LAB — CBC
HCT: 41.3 % (ref 36.0–46.0)
Hemoglobin: 13.5 g/dL (ref 12.0–15.0)
MCH: 26.8 pg (ref 26.0–34.0)
MCHC: 32.7 g/dL (ref 30.0–36.0)
MCV: 82.1 fL (ref 78.0–100.0)
Platelets: 217 10*3/uL (ref 150–400)
RBC: 5.03 MIL/uL (ref 3.87–5.11)
RDW: 17.6 % — AB (ref 11.5–15.5)
WBC: 7.6 10*3/uL (ref 4.0–10.5)

## 2014-12-02 LAB — POC URINE PREG, ED: Preg Test, Ur: NEGATIVE

## 2014-12-02 MED ORDER — SODIUM CHLORIDE 0.9 % IV BOLUS (SEPSIS)
1000.0000 mL | Freq: Once | INTRAVENOUS | Status: AC
  Administered 2014-12-02: 1000 mL via INTRAVENOUS

## 2014-12-02 MED ORDER — POTASSIUM CHLORIDE CRYS ER 20 MEQ PO TBCR
40.0000 meq | EXTENDED_RELEASE_TABLET | Freq: Once | ORAL | Status: AC
  Administered 2014-12-02: 40 meq via ORAL
  Filled 2014-12-02: qty 2

## 2014-12-02 NOTE — Discharge Instructions (Signed)
Return to the ED with any concerns including fainting, chest pain, vomiting and not able to keep down liquids, decreased level of alertness/lethargy, or any other alarming symptoms  You should keep your prednisone dose at 30mg  until you discuss with your GI doctor or have a followup appointment

## 2014-12-02 NOTE — ED Notes (Signed)
Per EMS - pt come from [facility] with c/o orthostatic hypotension. Pt was seeing PCP for follow-up regarding bowel blockage that has been resolved.  As pt entered room to get VS, pt reported feeling funny and lightheaded.  BP at that time was 67/48 sitting.  BP was 108 Systolic at MD office.  Pt was monitored for a couple hours at PCP office until she became stabilized, but BP did not return to normal upon standing.  Pt reports being placed on 40mg  Prednisone a a month ago, which was tapered down to 30mg  a few weeks ago and to 20mg  last week.  Pt could not tolerate 20mg , and was placed back on 30mg .  Pt denies loss of consciousness, no injuries.  Pt did eat breakfast this morning. NAD at this time.

## 2014-12-02 NOTE — ED Notes (Signed)
EKG completed, will not export until ordered.

## 2014-12-02 NOTE — ED Provider Notes (Signed)
CSN: [id number]     Arrival date & time 12/02/14  1327 History   First MD Initiated Contact with Patient 12/02/14 1343     Chief Complaint  Patient presents with  . Hypotension     (Consider location/radiation/quality/duration/timing/severity/associated sxs/prior Treatment) HPI  Pt presenting with c/o hypotension.  She was being seen at her PMDs office for a followup appointment for a small bowel obstruction for which she was hospitalized one month ago.  She has been on prednisone since then for a presumed diagnosis of Crohn's disease.  She was lightheaded and nearly fainted last week when she decreased prednisone dose from  to20 mg so her doctor asked her to stay on the  prednisone.  This morning she decreased her dose to  again.  Today she felt lightheaded and dizzy at the doctor's office and was found to have orthostatic hypotension.  No syncope, no palpitations, no chest pain.  There are no other associated systemic symptoms, there are no other alleviating or modifying factors  Past Medical History  Diagnosis Date  . Anxiety   . Depression   . Dysmenorrhea   . Ocular migraine   . Migraine with aura   . IUD (intrauterine device) in place 02/2013    skyla  . SBO (small bowel obstruction) 09/2014   Past Surgical History  Procedure Laterality Date  . Wisdom tooth extraction  Age 45/20  . Eye surgery  Age 65    Lasix   Family History  Problem Relation Age of Onset  . Diabetes Father   . Lung cancer Maternal Uncle   . Breast cancer Paternal Aunt   . Breast cancer Maternal Grandmother    History  Substance Use Topics  . Smoking status: Never Smoker   . Smokeless tobacco: Never Used  . Alcohol Use: 1.0 oz/week    2 Standard drinks or equivalent per week   OB History    Gravida Para Term Preterm AB TAB SAB Ectopic Multiple Living       Review of Systems  ROS reviewed and all otherwise negative except for mentioned in HPI    Allergies   Tramadol and Sudafed  Home Medications   Prior to Admission medications   Medication Sig Start Date End Date Taking? Authorizing Provider  Aspirin-Acetaminophen-Caffeine (EXCEDRIN PO) Take 1 tablet by mouth daily as needed (for headache).    Yes Historical Provider, MD  calcium carbonate (TUMS - DOSED IN MG ELEMENTAL CALCIUM) 500 MG chewable tablet Chew 1 tablet by mouth 2 (two) times daily with a meal. heartburn   Yes Historical Provider, MD  clidinium-chlordiazePOXIDE (LIBRAX) 5-2.5 MG per capsule Take 1 capsule by mouth 4 (four) times daily as needed (for panic attack).    Yes Historical Provider, MD  escitalopram (LEXAPRO) 10 MG tablet Take 10 mg by mouth daily.   Yes Historical Provider, MD  Levonorgestrel (SKYLA) 13.5 MG IUD by Intrauterine route.   Yes Historical Provider, MD  Multiple Vitamins-Minerals (MULTIVITAMIN PO) Take 1 tablet by mouth daily.    Yes Historical Provider, MD  ondansetron (ZOFRAN-ODT) 4 MG disintegrating tablet Take 1 tablet (4 mg total) by mouth every 6 (six) hours as needed for nausea or vomiting. 10/28/14  Yes Ripudeep K Rai, MD  pantoprazole (PROTONIX) 40 MG tablet Take 1 tablet (40 mg total) by mouth daily. Patient taking differently: Take 40 mg by mouth every evening.  10/28/14  Yes Ripudeep Jenna Luo, MD  predniSONE (  DELTASONE) 20 MG tablet Take 1 tablet (20 mg total) by mouth 2 (two) times daily with a meal. Patient taking differently: Take 10-20 mg by mouth 2 (two) times daily with a meal. Take 20 mg in the morning and 10 mg at bedtime 10/28/14  Yes Ripudeep Jenna Luo, MD  simethicone (GAS-X) 80 MG chewable tablet Chew 2 tablets (160 mg total) by mouth 4 (four) times daily as needed for flatulence (also available over the counter). 10/28/14  Yes Ripudeep K Rai, MD   BP 118/83 mmHg  Pulse 81  Temp(Src) 98.8 F (37.1 C) (Oral)  Resp 18  Ht 5\' 1"  (1.549 m)  Wt 138 lb (62.596 kg)  BMI 26.09 kg/m2  SpO2 100%  Vitals reviewed Physical Exam  Physical Examination:  General appearance - alert, well appearing, and in no distress Mental status - alert, oriented to person, place, and time Eyes - no conjunctival injection, no scleral icterus Mouth - mucous membranes moist, pharynx normal without lesions Chest - clear to auscultation, no wheezes, rales or rhonchi, symmetric air entry Heart - normal rate, regular rhythm, normal S1, S2, no murmurs, rubs, clicks or gallops Abdomen - soft, nontender, nondistended, no masses or organomegaly Neurological - awake, alert Extremities - peripheral pulses normal, no pedal edema, no clubbing or cyanosis Skin - normal coloration and turgor, no rashes  ED Course  Procedures (including critical care time) Labs Review Labs Reviewed  CBC - Abnormal; Notable for the following:    RDW 17.6 (*)    All other components within normal limits  BASIC METABOLIC PANEL - Abnormal; Notable for the following:    Potassium 3.3 (*)    Chloride 96 (*)    Glucose, Bld 160 (*)    Calcium 8.8 (*)    All other components within normal limits  POC URINE PREG, ED    Imaging Review No results found.   EKG Interpretation   Date/Time:  Thursday December 02 2014 15:21:17 EDT Ventricular Rate:  75 PR Interval:  125 QRS Duration: 82 QT Interval:  373 QTC Calculation: 417 R Axis:   53 Text Interpretation:  Sinus rhythm ED PHYSICIAN INTERPRETATION AVAILABLE  IN [facility] Confirmed by TEST, Record ([id number]) on 12/03/2014 7:22:00  AM      MDM   Final diagnoses:  Orthostatic hypotension  Hypokalemia    Pt presenting with c/o hypotension, she is on a slow prednisone taper and when she has gone down to 20mg  she has had hypotension as well.  Electrolytes show low potassium- this was repleted.  Pt feeling improved after IV fluids and is no longer hpyotensive.  Advised to stay on 30mg  prednisone until she can followup with her doctor and will likely need a slower taper.  Discharged with strict return precautions.  Pt agreeable with  plan.    Jerelyn Scott, MD 12/03/14 1005

## 2014-12-29 ENCOUNTER — Other Ambulatory Visit: Payer: Self-pay | Admitting: Gastroenterology

## 2014-12-29 DIAGNOSIS — K589 Irritable bowel syndrome without diarrhea: Secondary | ICD-10-CM

## 2014-12-29 DIAGNOSIS — R197 Diarrhea, unspecified: Secondary | ICD-10-CM

## 2015-01-05 ENCOUNTER — Inpatient Hospital Stay (HOSPITAL_COMMUNITY)
Admission: EM | Admit: 2015-01-05 | Discharge: 2015-01-09 | DRG: 389 | Disposition: A | Discharge status: Home / Self Care | Payer: 59 | Attending: Internal Medicine | Admitting: Internal Medicine

## 2015-01-05 ENCOUNTER — Encounter (HOSPITAL_COMMUNITY): Payer: Self-pay | Admitting: Family Medicine

## 2015-01-05 ENCOUNTER — Ambulatory Visit
Admission: RE | Admit: 2015-01-05 | Discharge: 2015-01-05 | Disposition: A | Discharge status: Home / Self Care | Payer: 59 | Source: Ambulatory Visit | Attending: Gastroenterology | Admitting: Gastroenterology

## 2015-01-05 DIAGNOSIS — F329 Major depressive disorder, single episode, unspecified: Secondary | ICD-10-CM | POA: Diagnosis not present

## 2015-01-05 DIAGNOSIS — Z7952 Long term (current) use of systemic steroids: Secondary | ICD-10-CM

## 2015-01-05 DIAGNOSIS — K5660 Unspecified intestinal obstruction: Principal | ICD-10-CM | POA: Diagnosis present

## 2015-01-05 DIAGNOSIS — E860 Dehydration: Secondary | ICD-10-CM | POA: Diagnosis present

## 2015-01-05 DIAGNOSIS — G43909 Migraine, unspecified, not intractable, without status migrainosus: Secondary | ICD-10-CM | POA: Diagnosis present

## 2015-01-05 DIAGNOSIS — K589 Irritable bowel syndrome without diarrhea: Secondary | ICD-10-CM

## 2015-01-05 DIAGNOSIS — E871 Hypo-osmolality and hyponatremia: Secondary | ICD-10-CM | POA: Diagnosis present

## 2015-01-05 DIAGNOSIS — K8020 Calculus of gallbladder without cholecystitis without obstruction: Secondary | ICD-10-CM | POA: Diagnosis present

## 2015-01-05 DIAGNOSIS — F32A Depression, unspecified: Secondary | ICD-10-CM | POA: Diagnosis present

## 2015-01-05 DIAGNOSIS — R109 Unspecified abdominal pain: Secondary | ICD-10-CM | POA: Diagnosis not present

## 2015-01-05 DIAGNOSIS — Z888 Allergy status to other drugs, medicaments and biological substances status: Secondary | ICD-10-CM

## 2015-01-05 DIAGNOSIS — F419 Anxiety disorder, unspecified: Secondary | ICD-10-CM | POA: Diagnosis present

## 2015-01-05 DIAGNOSIS — K56609 Unspecified intestinal obstruction, unspecified as to partial versus complete obstruction: Secondary | ICD-10-CM | POA: Diagnosis present

## 2015-01-05 DIAGNOSIS — R197 Diarrhea, unspecified: Secondary | ICD-10-CM

## 2015-01-05 DIAGNOSIS — Z975 Presence of (intrauterine) contraceptive device: Secondary | ICD-10-CM | POA: Diagnosis not present

## 2015-01-05 DIAGNOSIS — K5669 Other intestinal obstruction: Secondary | ICD-10-CM | POA: Diagnosis not present

## 2015-01-05 LAB — CBC
HCT: 39.3 % (ref 36.0–46.0)
Hemoglobin: 13 g/dL (ref 12.0–15.0)
MCH: 26.7 pg (ref 26.0–34.0)
MCHC: 33.1 g/dL (ref 30.0–36.0)
MCV: 80.9 fL (ref 78.0–100.0)
Platelets: 311 10*3/uL (ref 150–400)
RBC: 4.86 MIL/uL (ref 3.87–5.11)
RDW: 14.9 % (ref 11.5–15.5)
WBC: 9.1 10*3/uL (ref 4.0–10.5)

## 2015-01-05 LAB — COMPREHENSIVE METABOLIC PANEL
ALT: 16 U/L (ref 14–54)
AST: 14 U/L — ABNORMAL LOW (ref 15–41)
Albumin: 3 g/dL — ABNORMAL LOW (ref 3.5–5.0)
Alkaline Phosphatase: 97 U/L (ref 38–126)
Anion gap: 11 (ref 5–15)
BUN: 6 mg/dL (ref 6–20)
CO2: 22 mmol/L (ref 22–32)
Calcium: 9 mg/dL (ref 8.9–10.3)
Chloride: 101 mmol/L (ref 101–111)
Creatinine, Ser: 0.77 mg/dL (ref 0.44–1.00)
GFR calc Af Amer: >60 mL/min (ref >60)
GFR calc non Af Amer: >60 mL/min (ref >60)
Glucose, Bld: 132 mg/dL — ABNORMAL HIGH (ref 65–99)
Potassium: 4.3 mmol/L (ref 3.5–5.1)
Sodium: 134 mmol/L — ABNORMAL LOW (ref 135–145)
Total Bilirubin: 0.4 mg/dL (ref 0.3–1.2)
Total Protein: 6.5 g/dL (ref 6.5–8.1)

## 2015-01-05 LAB — URINALYSIS, ROUTINE W REFLEX MICROSCOPIC
Bilirubin Urine: NEGATIVE
Glucose, UA: NEGATIVE mg/dL
Ketones, ur: NEGATIVE mg/dL
Leukocytes, UA: NEGATIVE
Nitrite: NEGATIVE
Protein, ur: NEGATIVE mg/dL
Specific Gravity, Urine: <1.005 — ABNORMAL LOW (ref 1.005–1.030)
Urobilinogen, UA: 1 mg/dL (ref 0.0–1.0)
pH: 5.5 (ref 5.0–8.0)

## 2015-01-05 LAB — LIPASE, BLOOD: Lipase: 14 U/L — ABNORMAL LOW (ref 22–51)

## 2015-01-05 LAB — URINE MICROSCOPIC-ADD ON

## 2015-01-05 MED ORDER — ONDANSETRON 4 MG PO TBDP
8.0000 mg | ORAL_TABLET | Freq: Once | ORAL | Status: AC
  Administered 2015-01-05: 8 mg via ORAL
  Filled 2015-01-05: qty 2

## 2015-01-05 MED ORDER — PANTOPRAZOLE SODIUM 40 MG IV SOLR
40.0000 mg | INTRAVENOUS | Status: DC
  Administered 2015-01-05 – 2015-01-07 (×3): 40 mg via INTRAVENOUS
  Filled 2015-01-05 (×3): qty 40

## 2015-01-05 MED ORDER — ONDANSETRON HCL 4 MG/2ML IJ SOLN: 4.0000 mg | Freq: Four times a day (QID) | INTRAMUSCULAR | Status: DC | PRN

## 2015-01-05 MED ORDER — METHYLPREDNISOLONE SODIUM SUCC 125 MG IJ SOLR
60.0000 mg | Freq: Two times a day (BID) | INTRAMUSCULAR | Status: DC
  Administered 2015-01-05 – 2015-01-07 (×4): 60 mg via INTRAVENOUS
  Filled 2015-01-05 (×4): qty 2

## 2015-01-05 MED ORDER — MORPHINE SULFATE 2 MG/ML IJ SOLN
1.0000 mg | INTRAMUSCULAR | Status: DC | PRN
  Administered 2015-01-05: 2 mg via INTRAVENOUS
  Filled 2015-01-05: qty 1

## 2015-01-05 MED ORDER — SODIUM CHLORIDE 0.9 % IV SOLN
INTRAVENOUS | Status: DC
  Administered 2015-01-05 – 2015-01-08 (×9): via INTRAVENOUS

## 2015-01-05 MED ORDER — ENOXAPARIN SODIUM 40 MG/0.4ML ~~LOC~~ SOLN
40.0000 mg | SUBCUTANEOUS | Status: DC
  Administered 2015-01-05 – 2015-01-08 (×4): 40 mg via SUBCUTANEOUS
  Filled 2015-01-05 (×4): qty 0.4

## 2015-01-05 MED ORDER — SODIUM CHLORIDE 0.9 % IJ SOLN
3.0000 mL | Freq: Two times a day (BID) | INTRAMUSCULAR | Status: DC
  Administered 2015-01-06 – 2015-01-07 (×2): 3 mL via INTRAVENOUS

## 2015-01-05 MED ORDER — PROMETHAZINE HCL 25 MG/ML IJ SOLN: 12.5000 mg | Freq: Four times a day (QID) | INTRAMUSCULAR | Status: DC | PRN

## 2015-01-05 MED ORDER — IOPAMIDOL (ISOVUE-300) INJECTION 61%
100.0000 mL | Freq: Once | INTRAVENOUS | Status: AC | PRN
  Administered 2015-01-05: 100 mL via INTRAVENOUS

## 2015-01-05 MED ORDER — FOLIC ACID 5 MG/ML IJ SOLN
1.0000 mg | Freq: Every day | INTRAMUSCULAR | Status: DC
  Administered 2015-01-05 – 2015-01-08 (×4): 1 mg via INTRAVENOUS
  Filled 2015-01-05 (×4): qty 0.2

## 2015-01-05 MED ORDER — THIAMINE HCL 100 MG/ML IJ SOLN
100.0000 mg | Freq: Every day | INTRAMUSCULAR | Status: DC
  Administered 2015-01-05 – 2015-01-08 (×4): 100 mg via INTRAVENOUS
  Filled 2015-01-05 (×4): qty 2

## 2015-01-05 NOTE — ED Provider Notes (Signed)
CSN: [id number]     Arrival date & time 01/05/15  1254 History  This chart was scribed for non-physician practitioner Joycie Peek, PA-C working with Raeford Razor, MD by Lyndel Safe, ED Scribe. This patient was seen in room TR01C/TR01C and the patient's care was started at 2:58 PM.      Chief Complaint  Patient presents with  . Abdominal Pain   HPI  HPI Comments: Shelby Ortiz is a 34 y.o. female, with a PMhx of anxiety, depression, dysmenorrhea, and SBO, who presents to the Emergency Department complaining of gradually worsening, constant, 5/10 abdominal cramping onset 2 weeks. She also notes associated nausea, bloating, cramping, diarrhea, and sharp right sided pain that intermittently comes during the night. She states she had 1 episode of emesis after drinking CT contrast today. Pt was sent to ED by her GI doctor at [facility] due to a CT scan today that showed an SBO. She reports 3 episodes of diarrhea (2 mixed with stool and 1 liquid) today.  Reports recent admission in May for SBO. While she was in the hospital she had several diagnostic procedures to rule out Crohn's disease. She is prescribed ondansetron which intermittently alleviates her abdominal cramps. Additionally notes she has been tapering off of prednisone since May 5th (currently at 10 mg daily, was on 40mg  daily); her next tapering is due in 3 days to 5mg  daily. Denies fevers, chills, melena, hematochezia, blood in emesis, pelvic pain, or urinary symptoms.    Past Medical History  Diagnosis Date  . Anxiety   . Depression   . Dysmenorrhea   . Ocular migraine   . Migraine with aura   . IUD (intrauterine device) in place 02/2013    skyla  . SBO (small bowel obstruction) 09/2014   Past Surgical History  Procedure Laterality Date  . Wisdom tooth extraction  Age [age]  . Eye surgery  Age 80    Lasix   Family History  Problem Relation Age of Onset  . Diabetes Father   . Lung cancer Maternal Uncle   . Breast cancer  Paternal Aunt   . Breast cancer Maternal Grandmother    History  Substance Use Topics  . Smoking status: Never Smoker   . Smokeless tobacco: Never Used  . Alcohol Use: 1.0 oz/week    2 Standard drinks or equivalent per week   OB History    Gravida Para Term Preterm AB TAB SAB Ectopic Multiple Living   0 0 0 0 0 0 0 0 0 0      Review of Systems  Gastrointestinal: Positive for nausea, vomiting, abdominal pain, diarrhea and abdominal distention. Negative for constipation and blood in stool.  Genitourinary: Negative for dysuria, decreased urine volume, difficulty urinating and pelvic pain.   Allergies  Tramadol and Sudafed  Home Medications   Prior to Admission medications   Medication Sig Start Date End Date Taking? Authorizing Provider  Aspirin-Acetaminophen-Caffeine (EXCEDRIN PO) Take 1 tablet by mouth daily as needed (for headache).     Historical Provider, MD  calcium carbonate (TUMS - DOSED IN MG ELEMENTAL CALCIUM) 500 MG chewable tablet Chew 1 tablet by mouth 2 (two) times daily with a meal. heartburn    Historical Provider, MD  clidinium-chlordiazePOXIDE (LIBRAX) 5-2.5 MG per capsule Take 1 capsule by mouth 4 (four) times daily as needed (for panic attack).     Historical Provider, MD  escitalopram (LEXAPRO) 10 MG tablet Take 10 mg by mouth daily.    Historical Provider, MD  Levonorgestrel (  SKYLA) 13.5 MG IUD by Intrauterine route.    Historical Provider, MD  Multiple Vitamins-Minerals (MULTIVITAMIN PO) Take 1 tablet by mouth daily.     Historical Provider, MD  ondansetron (ZOFRAN-ODT) 4 MG disintegrating tablet Take 1 tablet (4 mg total) by mouth every 6 (six) hours as needed for nausea or vomiting. 10/28/14   Ripudeep Jenna Luo, MD  pantoprazole (PROTONIX) 40 MG tablet Take 1 tablet (40 mg total) by mouth daily. Patient taking differently: Take 40 mg by mouth every evening.  10/28/14   Ripudeep Jenna Luo, MD  predniSONE (DELTASONE) 20 MG tablet Take 1 tablet (20 mg total) by mouth 2 (two)  times daily with a meal. Patient taking differently: Take 10-20 mg by mouth 2 (two) times daily with a meal. Take 20 mg in the morning and 10 mg at bedtime 10/28/14   Ripudeep Jenna Luo, MD  simethicone (GAS-X) 80 MG chewable tablet Chew 2 tablets (160 mg total) by mouth 4 (four) times daily as needed for flatulence (also available over the counter). 10/28/14   Ripudeep K Rai, MD   BP 121/94 mmHg  Pulse 91  Temp(Src) 98.2 F (36.8 C)  Resp 18  SpO2 95% Physical Exam  Constitutional: She is oriented to person, place, and time. She appears well-developed and well-nourished. No distress.  HENT:  Head: Normocephalic.  Right Ear: External ear normal.  Left Ear: External ear normal.  Mouth/Throat: No oropharyngeal exudate.  Eyes: Pupils are equal, round, and reactive to light. Right eye exhibits no discharge. Left eye exhibits no discharge. No scleral icterus.  Neck: No JVD present.  Cardiovascular: Normal rate, regular rhythm and normal heart sounds.   Pulmonary/Chest: Effort normal and breath sounds normal. No respiratory distress. She has no wheezes. She has no rales.  Abdominal: Soft. She exhibits distension.  Hypoactive bowel sounds; abdominal distention but remains soft.   Musculoskeletal: She exhibits no edema.  Lymphadenopathy:    She has no cervical adenopathy.  Neurological: She is alert and oriented to person, place, and time.  Skin: Skin is warm. No rash noted. No erythema. No pallor.  Psychiatric: She has a normal mood and affect. Her behavior is normal.  Nursing note and vitals reviewed.   ED Course  Procedures  DIAGNOSTIC STUDIES: Oxygen Saturation is 95% on RA, adequate by my interpretation.    COORDINATION OF CARE: 3:05 PM Discussed treatment plan with pt. Will consult with Hospitalist. Pt acknowledges and agrees to plan.   Labs Review Labs Reviewed  LIPASE, BLOOD - Abnormal; Notable for the following:    Lipase 14 (*)    All other components within normal limits   COMPREHENSIVE METABOLIC PANEL - Abnormal; Notable for the following:    Sodium 134 (*)    Glucose, Bld 132 (*)    Albumin 3.0 (*)    AST 14 (*)    All other components within normal limits  URINALYSIS, ROUTINE W REFLEX MICROSCOPIC (NOT AT [facility]) - Abnormal; Notable for the following:    Specific Gravity, Urine <1.005 (*)    Hgb urine dipstick TRACE (*)    All other components within normal limits  URINE MICROSCOPIC-ADD ON - Abnormal; Notable for the following:    Squamous Epithelial / LPF FEW (*)    All other components within normal limits  CBC    Imaging Review Ct Entero Abd/pelvis W/cm  01/05/2015   CLINICAL DATA:  Diarrhea. Followup of small bowel inflammation. Generalized abdominal pain and nausea. Recent hospitalization for small bowel obstruction.  Negative colonoscopy 1 month ago.  EXAM: CT ABDOMEN AND PELVIS WITH CONTRAST (ENTEROGRAPHY)  TECHNIQUE: Multidetector CT of the abdomen and pelvis during bolus administration of intravenous contrast. Negative oral contrast VoLumen was given.  CONTRAST:  ISOVUE-300 IOPAMIDOL (ISOVUE-300) INJECTION 61%  COMPARISON:  10/26/2014 and 10/19/2014.  FINDINGS: Lower chest: Clear lung bases. Normal heart size without pericardial or pleural effusion. Tiny hiatal hernia.  Hepatobiliary: Focal steatosis adjacent the falciform ligament. Multiple gallstones without acute cholecystitis or biliary duct dilatation.  Pancreas: Normal, without mass or ductal dilatation.  Spleen: Normal  Adrenals/Urinary Tract: Normal adrenal glands. Normal kidneys, without hydronephrosis. Normal urinary bladder.  Stomach/Bowel: Normal remainder of the stomach. Colon is relatively decompressed. Submucosal fat prominence within is nonspecific.  The terminal ileum is normal in caliber, without inflammation on image 102 of series 3. The extent of small bowel distension with neutral contrast is good. There is persistent moderate small bowel distention, within example loop measuring  up to 4.7 cm on the left side of the abdomen. 5.8 cm at the same level on 10/26/2014. Small bowel distension continues to the terminal ileum, where transition is again identified, including on images 99 through 102 of series 3. Also coronal images 39-52. No obstructing mass identified. No evidence of active bowel inflammation.  Vascular/Lymphatic: Normal caliber of the aorta and branch vessels. No retroperitoneal or retrocrural adenopathy. No pelvic sidewall adenopathy. Mildly prominent ileocolic mesenteric nodes are likely reactive.  Reproductive: Intrauterine device.  No adnexal mass.  Other: No significant free fluid.  Musculoskeletal: Mild convex right lumbar spine curvature. Degenerative disc disease at the lumbosacral junction with mild L4-5 disc bulge.  IMPRESSION: 1. Moderate small bowel obstruction, with transition again identified at the distal ileum. No obstructive mass identified. 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 2. Cholelithiasis.   Electronically Signed   By: Jeronimo Greaves M.D.   On: 01/05/2015 12:34     EKG Interpretation None     Meds given in ED:  Medications  0.9 %  sodium chloride infusion ( Intravenous New Bag/Given 01/05/15 1615)  methylPREDNISolone sodium succinate (SOLU-MEDROL) 125 mg/2 mL injection 60 mg (60 mg Intravenous Given 01/05/15 1616)  pantoprazole (PROTONIX) injection 40 mg (40 mg Intravenous Given 01/05/15 1616)  ondansetron (ZOFRAN) injection 4 mg (not administered)  morphine 2 MG/ML injection 1-2 mg (2 mg Intravenous Given 01/05/15 1630)  ondansetron (ZOFRAN-ODT) disintegrating tablet 8 mg (8 mg Oral Given 01/05/15 1523)    New Prescriptions   No medications on file   Filed Vitals:   01/05/15 1327 01/05/15 1628  BP: 121/94 129/86  Pulse: 91 80  Temp: 98.2 F (36.8 C)   Resp: 18 19  SpO2: 95% 99%    MDM  Vitals stable - WNL -afebrile Pt resting comfortably in ED. PE--physical exam shows abdominal distention, hypoactive bowel  sounds. No focal tenderness. Labwork--labs are baseline and noncontributory. No evidence of acute infection. Imaging--patient outpatient CT abdomen pelvis done today that showed evidence of small bowel obstruction. I personally reviewed the imaging and agree with the results as interpreted by the radiologist.  Consult with internal medicine, Junious Silk will see in the ED. Patient admitted. Patient stable, in good condition and is appropriate for transfer.   Final diagnoses:  Small bowel obstruction    I personally performed the services described in this documentation, which was scribed in my presence. The recorded information has been reviewed and is accurate.    Joycie Peek, PA-C 01/05/15 2354  Raeford Razor,  MD 01/07/15 1441

## 2015-01-05 NOTE — ED Notes (Signed)
Attempted report 

## 2015-01-05 NOTE — ED Notes (Signed)
Pt reports hx of same. Told to come here by MD. Pt reports stools that look like they have grease in them and abdominal cramping that comes and goes.

## 2015-01-05 NOTE — ED Notes (Signed)
Pt sent here from doctor with small bowel obstruction. sts cramping x 2 weeks. sts she has been eating okay. sts diarrhea x 2 weeks

## 2015-01-05 NOTE — H&P (Signed)
[facility] History and Physical                                                                                    Shelby Ortiz, is a 34 y.o. female  MRN: [id number]   DOB - [birth year]  Admit Date - 01/05/2015  Outpatient Primary MD for the patient is No PCP Per Patient  Referring MD: Juleen China / ER  Consulting M.D: Schooler/ GASTROENTEROLOGY  With History of -  Past Medical History  Diagnosis Date  . Anxiety   . Depression   . Dysmenorrhea   . Ocular migraine   . Migraine with aura   . IUD (intrauterine device) in place 02/2013    skyla  . SBO (small bowel obstruction) 09/2014      Past Surgical History  Procedure Laterality Date  . Wisdom tooth extraction  Age [age]  . Eye surgery  Age 24    Lasix    in for   Chief Complaint  Patient presents with  . 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 Patient improved with conservative management of bowel rest without NG tube and IV steroids. She was discharged on a low-residue diet. She subsequently followed up with Dr. Schooler/gastroenterology and underwent colonoscopy without a definitive diagnosis found including no evidence of Crohn's disease. Since discharge she has maintained a low residue diet and has been on a prednisone taper and currently is down to 10 mg per day. She reports a gradual 2-3 week history of gradual abdominal distention associated with loose stools and watery diarrhea. This past weekend she had 4 days of frothy oily-appearing watery stool that was malodorous without blood. She has not had any vomiting until today after she drank CT contrast. She was sent from the GI office to radiology to undergo CT enterography which revealed a moderate small bowel obstruction with a likely stricture at the distal ileum. During the last admission patient reported a 30 pound unintentional weight loss  starting in February 2016.  In the ER patient was afebrile with a blood pressure 121/94 pulse rate 91 respirations 18, room air saturations were 95%. CT enterography again reveals moderate small bowel obstruction with transition at the distal ileum without obstructive mass identified with likely etiology is related to be infectious or inflammatory in nature. Laboratory data unremarkable except for mild hyponatremia with a sodium of 134, mild hyperglycemia glucose 132, low albumin at 3.0. Urinalysis was unremarkable.   Review of Systems   In addition to the HPI above,  No Fever-chills, myalgias or other constitutional symptoms No Headache, changes with Vision or hearing, new weakness, tingling, numbness in any extremity, No problems swallowing food or Liquids, indigestion/reflux No Chest pain, Cough or Shortness of Breath, palpitations, orthopnea or DOE No melena or hematochezia, no dark tarry stools No dysuria, hematuria or flank pain No new skin rashes, lesions, masses or bruises, No new joints pains-aches No recent weight gain or loss No polyuria, polydypsia or polyphagia,  *A full 10 point Review of Systems was done, except as stated above, all  other Review of Systems were negative.  Social History History  Substance Use Topics  . Smoking status: Never Smoker   . Smokeless tobacco: Never Used  . Alcohol Use: 1.0 oz/week    2 Standard drinks or equivalent per week    Resides at: Private residence  Lives with: Spouse  Ambulatory status: Without assistive devices   Family History Family History  Problem Relation Age of Onset  . Diabetes Father   . Lung cancer Maternal Uncle   . Breast cancer Paternal Aunt   . Breast cancer Maternal Grandmother      Prior to Admission medications   Medication Sig Start Date End Date Taking? Authorizing Provider  Aspirin-Acetaminophen-Caffeine (EXCEDRIN PO) Take 1 tablet by mouth daily as needed (for headache).     Historical Provider,  MD  calcium carbonate (TUMS - DOSED IN MG ELEMENTAL CALCIUM) 500 MG chewable tablet Chew 1 tablet by mouth 2 (two) times daily with a meal. heartburn    Historical Provider, MD  clidinium-chlordiazePOXIDE (LIBRAX) 5-2.5 MG per capsule Take 1 capsule by mouth 4 (four) times daily as needed (for panic attack).     Historical Provider, MD  escitalopram (LEXAPRO) 10 MG tablet Take 10 mg by mouth daily.    Historical Provider, MD  Levonorgestrel (SKYLA) 13.5 MG IUD by Intrauterine route.    Historical Provider, MD  Multiple Vitamins-Minerals (MULTIVITAMIN PO) Take 1 tablet by mouth daily.     Historical Provider, MD  ondansetron (ZOFRAN-ODT) 4 MG disintegrating tablet Take 1 tablet (4 mg total) by mouth every 6 (six) hours as needed for nausea or vomiting. 10/28/14   Ripudeep Jenna Luo, MD  pantoprazole (PROTONIX) 40 MG tablet Take 1 tablet (40 mg total) by mouth daily. Patient taking differently: Take 40 mg by mouth every evening.  10/28/14   Ripudeep Jenna Luo, MD  predniSONE (DELTASONE) 20 MG tablet Take 1 tablet (20 mg total) by mouth 2 (two) times daily with a meal. Patient taking differently: Take 10-20 mg by mouth 2 (two) times daily with a meal. Take 20 mg in the morning and 10 mg at bedtime 10/28/14   Ripudeep Jenna Luo, MD  simethicone (GAS-X) 80 MG chewable tablet Chew 2 tablets (160 mg total) by mouth 4 (four) times daily as needed for flatulence (also available over the counter). 10/28/14   Ripudeep Jenna Luo, MD    Allergies  Allergen Reactions  . Tramadol Nausea And Vomiting  . Sudafed [Pseudoephedrine Hcl] Anxiety    Shaking     Physical Exam  Vitals  Blood pressure 121/94, pulse 91, temperature 98.2 F (36.8 C), resp. rate 18, SpO2 95 %.   General:  In mild distress as evidenced by ongoing colicky abdominal pain also appears somewhat pale  Psych:  Normal affect, Denies Suicidal or Homicidal ideations, Awake Alert, Oriented X 3. Speech and thought patterns are clear and appropriate, no apparent  short term memory deficits  Neuro:   No focal neurological deficits, CN II through XII intact, Strength 5/5 all 4 extremities, Sensation intact all 4 extremities.  ENT:  Ears and Eyes appear Normal, Conjunctivae clear, PER. Moist oral mucosa without erythema or exudates.  Neck:  Supple, No lymphadenopathy appreciated  Respiratory:  Symmetrical chest wall movement, Good air movement bilaterally, CTAB. Room Air  Cardiac:  RRR, No Murmurs, no LE edema noted, no JVD, No carotid bruits, peripheral pulses palpable at 2+  Abdomen:  Near absent to occasionally tingling bowel sounds, Soft but markedly distended, somewhat tender right  upper lateral abdomen,  No masses appreciated, no obvious hepatosplenomegaly  Skin:  No Cyanosis, Normal Skin Turgor, No Skin Rash or Bruise. Pale  Extremities: Symmetrical without obvious trauma or injury,  no effusions.  Data Review  CBC  Recent Labs Lab 01/05/15 1338  WBC 9.1  HGB 13.0  HCT 39.3  PLT 311  MCV 80.9  MCH 26.7  MCHC 33.1  RDW 14.9    Chemistries   Recent Labs Lab 01/05/15 1338  NA 134*  K 4.3  CL 101  CO2 22  GLUCOSE 132*  BUN 6  CREATININE 0.77  CALCIUM 9.0  AST 14*  ALT 16  ALKPHOS 97  BILITOT 0.4    CrCl cannot be calculated (Unknown ideal weight.).  No results for input(s): TSH, T4TOTAL, T3FREE, THYROIDAB in the last 72 hours.  Invalid input(s): FREET3  Coagulation profile No results for input(s): INR, PROTIME in the last 168 hours.  No results for input(s): DDIMER in the last 72 hours.  Cardiac Enzymes No results for input(s): CKMB, TROPONINI, MYOGLOBIN in the last 168 hours.  Invalid input(s): CK  Invalid input(s): POCBNP  Urinalysis    Component Value Date/Time   COLORURINE YELLOW 01/05/2015 1343   APPEARANCEUR CLEAR 01/05/2015 1343   LABSPEC <1.005* 01/05/2015 1343   PHURINE 5.5 01/05/2015 1343   GLUCOSEU NEGATIVE 01/05/2015 1343   GLUCOSEU NEGATIVE 02/12/2007 1620   HGBUR TRACE* 01/05/2015  1343   BILIRUBINUR NEGATIVE 01/05/2015 1343   BILIRUBINUR negative 01/15/2013 1534   KETONESUR NEGATIVE 01/05/2015 1343   PROTEINUR NEGATIVE 01/05/2015 1343   PROTEINUR negative 01/15/2013 1534   UROBILINOGEN 1.0 01/05/2015 1343   UROBILINOGEN negative 01/15/2013 1534   NITRITE NEGATIVE 01/05/2015 1343   NITRITE - 01/15/2013 1534   LEUKOCYTESUR NEGATIVE 01/05/2015 1343    Imaging results:   Ct Entero Abd/pelvis W/cm  01/05/2015   CLINICAL DATA:  Diarrhea. Followup of small bowel inflammation. Generalized abdominal pain and nausea. Recent hospitalization for small bowel obstruction. Negative colonoscopy 1 month ago.  EXAM: CT ABDOMEN AND PELVIS WITH CONTRAST (ENTEROGRAPHY)  TECHNIQUE: Multidetector CT of the abdomen and pelvis during bolus administration of intravenous contrast. Negative oral contrast VoLumen was given.  CONTRAST:  ISOVUE-300 IOPAMIDOL (ISOVUE-300) INJECTION 61%  COMPARISON:  10/26/2014 and 10/19/2014.  FINDINGS: Lower chest: Clear lung bases. Normal heart size without pericardial or pleural effusion. Tiny hiatal hernia.  Hepatobiliary: Focal steatosis adjacent the falciform ligament. Multiple gallstones without acute cholecystitis or biliary duct dilatation.  Pancreas: Normal, without mass or ductal dilatation.  Spleen: Normal  Adrenals/Urinary Tract: Normal adrenal glands. Normal kidneys, without hydronephrosis. Normal urinary bladder.  Stomach/Bowel: Normal remainder of the stomach. Colon is relatively decompressed. Submucosal fat prominence within is nonspecific.  The terminal ileum is normal in caliber, without inflammation on image 102 of series 3. The extent of small bowel distension with neutral contrast is good. There is persistent moderate small bowel distention, within example loop measuring up to 4.7 cm on the left side of the abdomen. 5.8 cm at the same level on 10/26/2014. Small bowel distension continues to the terminal ileum, where transition is again identified,  including on images 99 through 102 of series 3. Also coronal images 39-52. No obstructing mass identified. No evidence of active bowel inflammation.  Vascular/Lymphatic: Normal caliber of the aorta and branch vessels. No retroperitoneal or retrocrural adenopathy. No pelvic sidewall adenopathy. Mildly prominent ileocolic mesenteric nodes are likely reactive.  Reproductive: Intrauterine device.  No adnexal mass.  Other: No significant free fluid.  Musculoskeletal: Mild convex right lumbar spine curvature. Degenerative disc disease at the lumbosacral junction with mild L4-5 disc bulge.  IMPRESSION: 1. Moderate small bowel obstruction, with transition again identified at the distal ileum. No obstructive mass identified. 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 2. Cholelithiasis.   Electronically Signed   By: Jeronimo Greaves M.D.   On: 01/05/2015 12:34     Assessment & Plan  Principal Problem:    Small bowel obstruction -Admit to medical floor -Await formal GI consultation -Continue conservative treatment with bowel rest and NPO w/ice chips-not actively vomiting so defer NG tube at this juncture-unclear if we'll need surgical evaluation this admission -Recurrent and distal ileum; recent outpatient workup without definitive diagnosis  -Solu-Medrol 60 mg IV every 12 hours -Protonix 40 mg IV daily while on steroids -Supportive care with anti-medics and IV narcotics  Active Problems:   Dehydration with hyponatremia -Normal saline at 100 mL per hour -Labs in a.m.    Anxiety/Depression -NPO so preadmission medications on hold    DVT Prophylaxis: Lovenox  Family Communication:   Spouse at bedside  Code Status:  Full code  Condition:  Stable  Discharge disposition: Anticipate discharge back to home once bowel obstructive symptoms have resolved  Time spent in minutes : 60      ELLIS,ALLISON L. ANP on 01/05/2015 at 4:06 PM  Between 7am to 7pm - Pager -  [phone]  After 7pm go to [email or web address] - password [email or web address]  And look for the night coverage person covering me after hours  [facility]

## 2015-01-06 DIAGNOSIS — F329 Major depressive disorder, single episode, unspecified: Secondary | ICD-10-CM

## 2015-01-06 DIAGNOSIS — K5669 Other intestinal obstruction: Secondary | ICD-10-CM

## 2015-01-06 LAB — COMPREHENSIVE METABOLIC PANEL
ALBUMIN: 2.4 g/dL — AB (ref 3.5–5.0)
ALK PHOS: 80 U/L (ref 38–126)
ALT: 13 U/L — ABNORMAL LOW (ref 14–54)
AST: 12 U/L — AB (ref 15–41)
Anion gap: 8 (ref 5–15)
BUN: 7 mg/dL (ref 6–20)
CO2: 24 mmol/L (ref 22–32)
CREATININE: 0.82 mg/dL (ref 0.44–1.00)
Calcium: 8.4 mg/dL — ABNORMAL LOW (ref 8.9–10.3)
Chloride: 105 mmol/L (ref 101–111)
GFR calc non Af Amer: >60 mL/min (ref >60)
Glucose, Bld: 104 mg/dL — ABNORMAL HIGH (ref 65–99)
Potassium: 4.1 mmol/L (ref 3.5–5.1)
SODIUM: 137 mmol/L (ref 135–145)
Total Bilirubin: 0.4 mg/dL (ref 0.3–1.2)
Total Protein: 4.9 g/dL — ABNORMAL LOW (ref 6.5–8.1)

## 2015-01-06 LAB — CBC
HEMATOCRIT: 34.6 % — AB (ref 36.0–46.0)
HEMOGLOBIN: 11.1 g/dL — AB (ref 12.0–15.0)
MCH: 26.6 pg (ref 26.0–34.0)
MCHC: 32.1 g/dL (ref 30.0–36.0)
MCV: 82.8 fL (ref 78.0–100.0)
PLATELETS: 294 10*3/uL (ref 150–400)
RBC: 4.18 MIL/uL (ref 3.87–5.11)
RDW: 15.2 % (ref 11.5–15.5)
WBC: 5.8 10*3/uL (ref 4.0–10.5)

## 2015-01-06 NOTE — Progress Notes (Addendum)
Patient known to me a little but followed by my partner Dr. Bosie Clos with recent negative colonoscopy to the distal TI and endoscopy which were normal including biopsies and I recommended surgical consult and I do not think patient has Crohn's but you could call Dr. Bosie Clos if you required any additional information but there was no signs of Crohn's on her previous CTs just obstructions and please call me back if I could be of any further assistance and hopefully Dr. Bosie Clos will be available tomorrow to see her if we are still needed

## 2015-01-06 NOTE — Consult Note (Signed)
[facility] Surgery Consult Note  Shelby Ortiz January 24, 1981  [id number].    Requesting MD: Dr. Maryland Pink Chief Complaint/Reason for Consult: pSBO/ileitis   HPI:  34 y/o white female with PMH anxiety, depression, migraines, dysmenorrhea, and recurrent SBO.  Pt reports recurrent abdominal cramping and pain since January 2016. Patient has lost 40lbs since January due to anorexia and symptoms with eating.  Abdominal pain has no pattern and can last for a few hours to days or weeks.  Her pain moves and can be RUQ, LUQ, RLQ, or pelvic.  No relation to her menstrual cycles, no identifiable food triggers.  It is associated anxiety, bloating, cramping, chills, nausea, constipation or diarrhea, pain in abdomen with urination.  Has had recurrent episodes of nonbilious nonbloody emesis.  Intermittent diarrhea-nonbloody with some mucous and "frothy greasy foul smelling stools".  No radiating pain, no concrete precipitating factors.  Zofran and extra strength tylenol seem to help her symptoms some times.    Patient was seen in February at an Urgent Care and diagnosed with IBS.  She was started on dicyclomine and subsequently started on lexipro which did not help her "IBS symptoms", but significantly improved her anxiety/depression.  She started an "IBS diet" and eliminated alcohol and caffeine which she has noticed are triggers.  Never smoker, light drinker, no drug use, no excessive NSAID use.  CT of the abdomen showed small bowel obstruction in the mid to distal small bowel with transition point in the distal ileum with no surrounding bowel inflammation.  Gastroenterology was consulted and treated conservatively, but started on steroids due to possible concern for Crohn's or IBD which significantly improved her symptoms.  She has seen Dr. Truman Hayward, Dr. Watt Climes, and Dr. Michail Sermon.  If not IBD, Dr. Truman Hayward thought she may have endometriosis given her h/o dysmenorrhea prior to starting on birth control.    She has had a  total of 4 hospital visits and this is her second admission for a similar issue.  Dr. Watt Climes ordered a CT of abdomen and pelvis was performed on 10/19/14 that showed pSBO, moderate mid and distal small bowel obstruction with transition identified at the distal ileum. Some concern for possible Crohn's disease. She was subsequently admitted to the hospital between 10/19/14-10/28/14.  Patient underwent CT enterography on 10/26/14 which showed improved, but persistent dilatation of mid to distal small bowel and distention of stomach, terminal ileum was decompressed consistent with partial SBO in the proximal ileum without clear Crohn's finding.  Patient was ultimately discharged home on a low fiber/low residue diet and transitioned to oral prednisone.  She was scheduled with Dr. Michail Sermon for UGI and CSP which were completed, but he was unable to reach the distal ileum.  He reportedly took several biopsies which were all negative.  Of note the patient notes trouble with her bowels leading back to 2005-2007 when she lived in [place] with her husband.  She said she had "food poisoning" multiple times while in the country of [place] with abdominal pain, N/V/D.  She was never treated.  She went back to [place] in 2012 and hasn't been back since.  Husband had similar episodes of travelers diarrhea, but his resolved.  Reportedly she has had some stool/lab testing to check for parasites.  Also, she reportedly has episodes where she "almost blacks out" and gets hypotensive.  Yesterday (01/05/15) she had another repeat CT enterography, but she threw up all the oral contrast right before the scan.  The scan showed again moderate small bowel obstruction with  transition.  No obstructive mass, favored infectious or inflammatory stricture.  She did have cholelithiasis.  She was advised to come to the ER for additional workup.  Her abdomianl pain is very minimal at this time, she is not nauseated, she is having flatus and diarrhea.  She is NPO, on  IV steroids 60m BID, and protonix.  We were asked to evaluate about possible surgical resection of this area given re-occurrence of symptoms and evidence of inflammatory obstruction on multiple CT's.     ROS: All systems reviewed and otherwise negative except for as above  Family History  Problem Relation Age of Onset  . Diabetes Father   . Lung cancer Maternal Uncle   . Breast cancer Paternal Aunt   . Breast cancer Maternal Grandmother     Past Medical History  Diagnosis Date  . Anxiety   . Depression   . Dysmenorrhea   . IUD (intrauterine device) in place 02/2013    skyla  . SBO (small bowel obstruction) 09/2014; 01/05/2015  . Ocular migraine     "very rare" (01/05/2015)  . Migraine with aura     "maybe monthly, if that" (01/05/2015)    Past Surgical History  Procedure Laterality Date  . Wisdom tooth extraction  ~ 2000    "all 4"  . Refractive surgery Bilateral 2000    Social History:  reports that she has never smoked. She has never used smokeless tobacco. She reports that she drinks about 1.0 oz of alcohol per week. She reports that she does not use illicit drugs.  Allergies:  Allergies  Allergen Reactions  . Tramadol Nausea And Vomiting  . Sudafed [Pseudoephedrine Hcl] Anxiety    Shaking     Medications Prior to Admission  Medication Sig Dispense Refill  . Aspirin-Acetaminophen-Caffeine (EXCEDRIN PO) Take 1 tablet by mouth daily as needed (for headache).     .Marland KitchenBISACODYL 5 MG EC tablet Take 5 mg by mouth 2 (two) times daily.  0  . calcium carbonate (TUMS - DOSED IN MG ELEMENTAL CALCIUM) 500 MG chewable tablet Chew 1 tablet by mouth 2 (two) times daily with a meal. heartburn    . clonazePAM (KLONOPIN) 0.5 MG tablet Take 0.5 mg by mouth as needed for anxiety.    .Marland Kitchenescitalopram (LEXAPRO) 10 MG tablet Take 10 mg by mouth daily.    . Levonorgestrel (SKYLA) 13.5 MG IUD by Intrauterine route.    . Multiple Vitamins-Minerals (MULTIVITAMIN PO) Take 1 tablet by mouth daily.      . ondansetron (ZOFRAN-ODT) 4 MG disintegrating tablet Take 1 tablet (4 mg total) by mouth every 6 (six) hours as needed for nausea or vomiting. 30 tablet 0  . pantoprazole (PROTONIX) 40 MG tablet Take 1 tablet (40 mg total) by mouth daily. (Patient taking differently: Take 40 mg by mouth every evening. ) 30 tablet 4  . predniSONE (DELTASONE) 20 MG tablet Take 1 tablet (20 mg total) by mouth 2 (two) times daily with a meal. (Patient taking differently: Take 10 mg by mouth every morning. ) 60 tablet 3  . simethicone (GAS-X) 80 MG chewable tablet Chew 2 tablets (160 mg total) by mouth 4 (four) times daily as needed for flatulence (also available over the counter). 60 tablet 1  . clidinium-chlordiazePOXIDE (LIBRAX) 5-2.5 MG per capsule Take 1 capsule by mouth 4 (four) times daily as needed (for panic attack).       Blood pressure 125/84, pulse 78, temperature 97.9 F (36.6 C), temperature source Oral, resp.  rate 17, last menstrual period 08/08/2014, SpO2 96 %. Physical Exam: General: pleasant, WD/WN white female who is laying in bed in NAD HEENT: head is normocephalic, atraumatic.  Sclera are noninjected.  Wears glasses.  PERRL.  Ears and nose without any masses or lesions.  Mouth is pink and moist Heart: regular, rate, and rhythm.  No obvious murmurs, gallops, or rubs noted.  Palpable pedal pulses bilaterally Lungs: CTAB, no wheezes, rhonchi, or rales noted.  Respiratory effort nonlabored Abd: soft, mild distension, NT, +BS, no masses, hernias, or organomegaly, no scars noted, some striae in lower abdomen MS: all 4 extremities are symmetrical with no cyanosis, clubbing, or edema. Skin: warm and dry with no masses, lesions, or rashes.  Striae on lower abdomen Psych: A&Ox3 with an appropriate affect.   Results for orders placed or performed during the hospital encounter of 01/05/15 (from the past 48 hour(s))  Lipase, blood     Status: Abnormal   Collection Time: 01/05/15  1:38 PM  Result Value  Ref Range   Lipase 14 (L) 22 - 51 U/L  Comprehensive metabolic panel     Status: Abnormal   Collection Time: 01/05/15  1:38 PM  Result Value Ref Range   Sodium 134 (L) 135 - 145 mmol/L   Potassium 4.3 3.5 - 5.1 mmol/L   Chloride 101 101 - 111 mmol/L   CO2 22 22 - 32 mmol/L   Glucose, Bld 132 (H) 65 - 99 mg/dL   BUN 6 6 - 20 mg/dL   Creatinine, Ser 0.77 0.44 - 1.00 mg/dL   Calcium 9.0 8.9 - 10.3 mg/dL   Total Protein 6.5 6.5 - 8.1 g/dL   Albumin 3.0 (L) 3.5 - 5.0 g/dL   AST 14 (L) 15 - 41 U/L   ALT 16 14 - 54 U/L   Alkaline Phosphatase 97 38 - 126 U/L   Total Bilirubin 0.4 0.3 - 1.2 mg/dL   GFR calc non Af Amer >60 >60 mL/min   GFR calc Af Amer >60 >60 mL/min    Comment: (NOTE) The eGFR has been calculated using the CKD EPI equation. This calculation has not been validated in all clinical situations. eGFR's persistently <60 mL/min signify possible Chronic Kidney Disease.    Anion gap 11 5 - 15  CBC     Status: None   Collection Time: 01/05/15  1:38 PM  Result Value Ref Range   WBC 9.1 4.0 - 10.5 K/uL   RBC 4.86 3.87 - 5.11 MIL/uL   Hemoglobin 13.0 12.0 - 15.0 g/dL   HCT 39.3 36.0 - 46.0 %   MCV 80.9 78.0 - 100.0 fL   MCH 26.7 26.0 - 34.0 pg   MCHC 33.1 30.0 - 36.0 g/dL   RDW 14.9 11.5 - 15.5 %   Platelets 311 150 - 400 K/uL  Urinalysis, Routine w reflex microscopic (not at [facility])     Status: Abnormal   Collection Time: 01/05/15  1:43 PM  Result Value Ref Range   Color, Urine YELLOW YELLOW   APPearance CLEAR CLEAR   Specific Gravity, Urine <1.005 (L) 1.005 - 1.030   pH 5.5 5.0 - 8.0   Glucose, UA NEGATIVE NEGATIVE mg/dL   Hgb urine dipstick TRACE (A) NEGATIVE   Bilirubin Urine NEGATIVE NEGATIVE   Ketones, ur NEGATIVE NEGATIVE mg/dL   Protein, ur NEGATIVE NEGATIVE mg/dL   Urobilinogen, UA 1.0 0.0 - 1.0 mg/dL   Nitrite NEGATIVE NEGATIVE   Leukocytes, UA NEGATIVE NEGATIVE  Urine microscopic-add on  Status: Abnormal   Collection Time: 01/05/15  1:43 PM  Result  Value Ref Range   Squamous Epithelial / LPF FEW (A) RARE   WBC, UA 0-2 <3 WBC/hpf   RBC / HPF 0-2 <3 RBC/hpf   Bacteria, UA RARE RARE  Comprehensive metabolic panel     Status: Abnormal   Collection Time: 01/06/15  3:34 AM  Result Value Ref Range   Sodium 137 135 - 145 mmol/L   Potassium 4.1 3.5 - 5.1 mmol/L   Chloride 105 101 - 111 mmol/L   CO2 24 22 - 32 mmol/L   Glucose, Bld 104 (H) 65 - 99 mg/dL   BUN 7 6 - 20 mg/dL   Creatinine, Ser 0.82 0.44 - 1.00 mg/dL   Calcium 8.4 (L) 8.9 - 10.3 mg/dL   Total Protein 4.9 (L) 6.5 - 8.1 g/dL   Albumin 2.4 (L) 3.5 - 5.0 g/dL   AST 12 (L) 15 - 41 U/L   ALT 13 (L) 14 - 54 U/L   Alkaline Phosphatase 80 38 - 126 U/L   Total Bilirubin 0.4 0.3 - 1.2 mg/dL   GFR calc non Af Amer >60 >60 mL/min   GFR calc Af Amer >60 >60 mL/min    Comment: (NOTE) The eGFR has been calculated using the CKD EPI equation. This calculation has not been validated in all clinical situations. eGFR's persistently <60 mL/min signify possible Chronic Kidney Disease.    Anion gap 8 5 - 15  CBC     Status: Abnormal   Collection Time: 01/06/15  3:34 AM  Result Value Ref Range   WBC 5.8 4.0 - 10.5 K/uL   RBC 4.18 3.87 - 5.11 MIL/uL   Hemoglobin 11.1 (L) 12.0 - 15.0 g/dL   HCT 34.6 (L) 36.0 - 46.0 %   MCV 82.8 78.0 - 100.0 fL   MCH 26.6 26.0 - 34.0 pg   MCHC 32.1 30.0 - 36.0 g/dL   RDW 15.2 11.5 - 15.5 %   Platelets 294 150 - 400 K/uL   Ct Entero Abd/pelvis W/cm  01/05/2015   CLINICAL DATA:  Diarrhea. Followup of small bowel inflammation. Generalized abdominal pain and nausea. Recent hospitalization for small bowel obstruction. Negative colonoscopy 1 month ago.  EXAM: CT ABDOMEN AND PELVIS WITH CONTRAST (ENTEROGRAPHY)  TECHNIQUE: Multidetector CT of the abdomen and pelvis during bolus administration of intravenous contrast. Negative oral contrast VoLumen was given.  CONTRAST:  145m ISOVUE-300 IOPAMIDOL (ISOVUE-300) INJECTION 61%  COMPARISON:  10/26/2014 and 10/19/2014.   FINDINGS: Lower chest: Clear lung bases. Normal heart size without pericardial or pleural effusion. Tiny hiatal hernia.  Hepatobiliary: Focal steatosis adjacent the falciform ligament. Multiple gallstones without acute cholecystitis or biliary duct dilatation.  Pancreas: Normal, without mass or ductal dilatation.  Spleen: Normal  Adrenals/Urinary Tract: Normal adrenal glands. Normal kidneys, without hydronephrosis. Normal urinary bladder.  Stomach/Bowel: Normal remainder of the stomach. Colon is relatively decompressed. Submucosal fat prominence within is nonspecific.  The terminal ileum is normal in caliber, without inflammation on image 102 of series 3. The extent of small bowel distension with neutral contrast is good. There is persistent moderate small bowel distention, within example loop measuring up to 4.7 cm on the left side of the abdomen. 5.8 cm at the same level on 10/26/2014. Small bowel distension continues to the terminal ileum, where transition is again identified, including on images 99 through 102 of series 3. Also coronal images 39-52. No obstructing mass identified. No evidence of active bowel inflammation.  Vascular/Lymphatic: Normal caliber of the aorta and branch vessels. No retroperitoneal or retrocrural adenopathy. No pelvic sidewall adenopathy. Mildly prominent ileocolic mesenteric nodes are likely reactive.  Reproductive: Intrauterine device.  No adnexal mass.  Other: No significant free fluid.  Musculoskeletal: Mild convex right lumbar spine curvature. Degenerative disc disease at the lumbosacral junction with mild L4-5 disc bulge.  IMPRESSION: 1. Moderate small bowel obstruction, with transition again identified at the distal ileum. No obstructive mass identified. 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 2. Cholelithiasis.   Electronically Signed   By: Abigail Miyamoto M.D.   On: 01/05/2015 12:34      Assessment/Plan Recurrent pSBO Ileitis Chronic abdominal  pain H/o dysmenorrhea Anxiety/depression Migraines   Plan: Agree with NPO and steroids for now, until I can have Dr. Ninfa Linden evaluate the patient.  Her situation is very complex and surgery has its own sets of risks/benefits.  Usually these are treated conservatively, but she has never had any surgery in the past, thus this is very perplexing.  Wonder if diagnostic laparoscopy would be indicated.  Her symptoms seemed classic for IBD, but none was found on biopsies.  The area of obstruction was not able to be reached by scope.  She does have a h/o remote travel, menstrual cycle abnormalities which could also be sources, but more rare than IBD.  GI is hesitant to perform capsule endoscopy and does not sound like they want to try any treatment for IBD at this time.      Nat Christen, [facility] Surgery 01/06/2015, 2:07 PM Pager: [phone]

## 2015-01-06 NOTE — Progress Notes (Signed)
[facility] PROGRESS NOTE  Shelby Ortiz ZOX:[id number] DOB: 23-May-1981 DOA: 01/05/2015  PCP: Leanor Rubenstein, MD  Brief HPI: 34 year old Caucasian female with past medical history of depression, anxiety, recent small bowel obstruction who presented with abdominal pain. She was hospitalized in May for similar complaints. There was a concern that she may have Crohn's disease. She was discharged on steroids. She apparently underwent a colonoscopy 3 weeks ago and biopsies did not suggest Crohn's disease. CT scan done yesterday revealed evidence for small bowel obstruction with a transition point at distal ileum. She was hospitalized for further management.  Past medical history:  Past Medical History  Diagnosis Date  . Anxiety   . Depression   . Dysmenorrhea   . IUD (intrauterine device) in place 02/2013    skyla  . SBO (small bowel obstruction) 09/2014; 01/05/2015  . Ocular migraine     "very rare" (01/05/2015)  . Migraine with aura     "maybe monthly, if that" (01/05/2015)    Consultants: Gen. surgery  Procedures: None  Antibiotics: None  Subjective: Patient feels better this morning. Abdomen is not as distended. She is passing gas. Denies any nausea, vomiting. She actually wants to eat.  Objective: Vital Signs  Filed Vitals:   01/05/15 1327 01/05/15 1628 01/05/15 2124 01/06/15 0502  BP: 121/94 129/86 120/72 125/84  Pulse: 91 80 74 78  Temp: 98.2 F (36.8 C)  98.5 F (36.9 C) 97.9 F (36.6 C)  TempSrc:   Oral Oral  Resp: SpO2: 95% 99% 93% 96%    Intake/Output Summary (Last 24 hours) at 01/06/15 1051 Last data filed at 01/06/15 1000  Gross per 24 hour  Intake   1721 ml  Output      0 ml  Net   1721 ml   There were no vitals filed for this visit.  General appearance: alert, cooperative, appears stated age and no distress Resp: clear to auscultation bilaterally Cardio: regular rate and rhythm, S1, S2 normal, no murmur, click, rub or gallop GI:  soft, non-tender; nondistended. bowel sounds normal; no masses,  no organomegaly Extremities: extremities normal, atraumatic, no cyanosis or edema Neurologic: No focal deficits.  Lab Results:  Basic Metabolic Panel:  Recent Labs Lab 01/05/15 1338 01/06/15 0334  NA 134* 137  K 4.3 4.1  CL 101 105  CO2 22 24  GLUCOSE 132* 104*  BUN 6 7  CREATININE 0.77 0.82  CALCIUM 9.0 8.4*   Liver Function Tests:  Recent Labs Lab 01/05/15 1338 01/06/15 0334  AST 14* 12*  ALT 16 13*  ALKPHOS 97 80  BILITOT 0.4 0.4  PROT 6.5 4.9*  ALBUMIN 3.0* 2.4*    Recent Labs Lab 01/05/15 1338  LIPASE 14*   CBC:  Recent Labs Lab 01/05/15 1338 01/06/15 0334  WBC 9.1 5.8  HGB 13.0 11.1*  HCT 39.3 34.6*  MCV 80.9 82.8  PLT 311 294    Studies/Results: Ct Entero Abd/pelvis W/cm  01/05/2015   CLINICAL DATA:  Diarrhea. Followup of small bowel inflammation. Generalized abdominal pain and nausea. Recent hospitalization for small bowel obstruction. Negative colonoscopy 1 month ago.  EXAM: CT ABDOMEN AND PELVIS WITH CONTRAST (ENTEROGRAPHY)  TECHNIQUE: Multidetector CT of the abdomen and pelvis during bolus administration of intravenous contrast. Negative oral contrast VoLumen was given.  CONTRAST:  ISOVUE-300 IOPAMIDOL (ISOVUE-300) INJECTION 61%  COMPARISON:  10/26/2014 and 10/19/2014.  FINDINGS: Lower chest: Clear lung bases. Normal heart size without pericardial or pleural effusion. Tiny  hiatal hernia.  Hepatobiliary: Focal steatosis adjacent the falciform ligament. Multiple gallstones without acute cholecystitis or biliary duct dilatation.  Pancreas: Normal, without mass or ductal dilatation.  Spleen: Normal  Adrenals/Urinary Tract: Normal adrenal glands. Normal kidneys, without hydronephrosis. Normal urinary bladder.  Stomach/Bowel: Normal remainder of the stomach. Colon is relatively decompressed. Submucosal fat prominence within is nonspecific.  The terminal ileum is normal in caliber,  without inflammation on image 102 of series 3. The extent of small bowel distension with neutral contrast is good. There is persistent moderate small bowel distention, within example loop measuring up to 4.7 cm on the left side of the abdomen. 5.8 cm at the same level on 10/26/2014. Small bowel distension continues to the terminal ileum, where transition is again identified, including on images 99 through 102 of series 3. Also coronal images 39-52. No obstructing mass identified. No evidence of active bowel inflammation.  Vascular/Lymphatic: Normal caliber of the aorta and branch vessels. No retroperitoneal or retrocrural adenopathy. No pelvic sidewall adenopathy. Mildly prominent ileocolic mesenteric nodes are likely reactive.  Reproductive: Intrauterine device.  No adnexal mass.  Other: No significant free fluid.  Musculoskeletal: Mild convex right lumbar spine curvature. Degenerative disc disease at the lumbosacral junction with mild L4-5 disc bulge.  IMPRESSION: 1. Moderate small bowel obstruction, with transition again identified at the distal ileum. No obstructive mass identified. 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 2. Cholelithiasis.   Electronically Signed   By: Jeronimo Greaves M.D.   On: 01/05/2015 12:34    Medications:  Scheduled: . enoxaparin (LOVENOX) injection  40 mg Subcutaneous Q24H  . folic acid  1 mg Intravenous Daily  . methylPREDNISolone (SOLU-MEDROL) injection  60 mg Intravenous Q12H  . pantoprazole (PROTONIX) IV  40 mg Intravenous Q24H  . sodium chloride  3 mL Intravenous Q12H  . thiamine  100 mg Intravenous Daily   Continuous: . sodium chloride 100 mL/hr at 01/06/15 1252   QDI:YMEBRAXE injection, ondansetron (ZOFRAN) IV, promethazine  Assessment/Plan:  Principal Problem:   Small bowel obstruction Active Problems:   Anxiety   Depression   Dehydration with hyponatremia   SBO (small bowel obstruction)    Small bowel obstruction Patient seems to  be improving. Discussed with Dr. Ewing Schlein with gastroenterology. He has reviewed her recent colonoscopy and biopsy reports, which did not suggest Crohn's. He recommends a surgery consultation. Discussed with general surgery and they will evaluate. For now, he recommends continuing steroids. Leave on ice chips until seen by surgery.  Incidental cholelithiasis Stable. LFTs are normal.  History of anxiety and depression Resume home medications when she is able to take orally  DVT Prophylaxis: Lovenox    Code Status: Full code  Family Communication: Discussed with the patient and her mother  Disposition Plan: We will return home when medically improved.     LOS: 1 day   [facility]  [facility] Pager [phone] 01/06/2015, 10:51 AM  If 7PM-7AM, please contact night-coverage at [email or web address], password [facility]

## 2015-01-06 NOTE — Progress Notes (Signed)
Initial Nutrition Assessment  DOCUMENTATION CODES:   Not applicable  INTERVENTION:   -RD will follow for diet advancement, supplement diet as appropriate  NUTRITION DIAGNOSIS:   Inadequate oral intake related to inability to eat as evidenced by NPO status.  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 Patient improved with conservative management of bowel rest without NG tube and IV steroids. She was discharged on a low-residue diet.  Pt admitted with partial SBO. Pt is awaiting GI and surgical evaluations. Pt currently does not have NGT due to absence of vomiting.   Pt unavailable at time of visit. Unable to perform nutrition-focused physical exam at this time. However, pt is familiar to this RD from a previous admission. She has had extensive GI work-up for potential crohn's disease. Per MD notes, pt tested negative for celiac disease.   Pt continues to lose weight since last admission; following a low residue diet. Per wt hx, UBW of 170#; noted 35# (20%) wt loss X 9 months.   Pt enjoyed [employer] supplement during last admission; RD will order once diet is advanced.   Diet Order:  Diet NPO time specified  Skin:  Reviewed, no issues  Last BM:  01/05/15  Height:   Ht Readings from Last 1 Encounters:  12/02/14 5\' 1"  (1.549 m)    Weight:   Wt Readings from Last 1 Encounters:  12/02/14 138 lb (62.596 kg)    Ideal Body Weight:  47.7 kg  Wt Readings from Last 10 Encounters:  12/02/14 138 lb (62.596 kg)  10/19/14 145 lb (65.772 kg)  02/24/14 173 lb (78.472 kg)  04/01/13 174 lb (78.926 kg)  03/02/13 172 lb (78.019 kg)  01/15/13  17 lb (7.711 kg)    BMI:  Estimated body mass index is 26.09 kg/(m^2) as calculated from the following:   Height as of 12/02/14: 5\' 1"  (1.549 m).   Weight as of 12/02/14: 138 lb (62.596 kg).  Estimated Nutritional Needs:   Kcal:  1600-1800  Protein:  70-80 grams  Fluid:  1.6-1.8 L  EDUCATION NEEDS:   No education needs identified at this time  Zedrick Springsteen A. Mayford Knife, RD, LDN, CDE Pager: [phone] After hours Pager: [phone]

## 2015-01-07 LAB — CBC
HEMATOCRIT: 33.7 % — AB (ref 36.0–46.0)
Hemoglobin: 10.8 g/dL — ABNORMAL LOW (ref 12.0–15.0)
MCH: 26.4 pg (ref 26.0–34.0)
MCHC: 32 g/dL (ref 30.0–36.0)
MCV: 82.4 fL (ref 78.0–100.0)
Platelets: 273 10*3/uL (ref 150–400)
RBC: 4.09 MIL/uL (ref 3.87–5.11)
RDW: 15.1 % (ref 11.5–15.5)
WBC: 5.1 10*3/uL (ref 4.0–10.5)

## 2015-01-07 LAB — BASIC METABOLIC PANEL
ANION GAP: 6 (ref 5–15)
BUN: 7 mg/dL (ref 6–20)
CHLORIDE: 109 mmol/L (ref 101–111)
CO2: 25 mmol/L (ref 22–32)
CREATININE: 0.73 mg/dL (ref 0.44–1.00)
Calcium: 8.6 mg/dL — ABNORMAL LOW (ref 8.9–10.3)
GFR calc Af Amer: >60 mL/min (ref >60)
Glucose, Bld: 97 mg/dL (ref 65–99)
Potassium: 4.2 mmol/L (ref 3.5–5.1)
Sodium: 140 mmol/L (ref 135–145)

## 2015-01-07 LAB — SURGICAL PCR SCREEN
MRSA, PCR: NEGATIVE
STAPHYLOCOCCUS AUREUS: NEGATIVE

## 2015-01-07 MED ORDER — METHYLPREDNISOLONE SODIUM SUCC 40 MG IJ SOLR
40.0000 mg | Freq: Two times a day (BID) | INTRAMUSCULAR | Status: DC
  Administered 2015-01-07 – 2015-01-09 (×5): 40 mg via INTRAVENOUS
  Filled 2015-01-07 (×5): qty 1

## 2015-01-07 MED ORDER — BOOST / RESOURCE BREEZE PO LIQD
1.0000 | Freq: Three times a day (TID) | ORAL | Status: DC
  Administered 2015-01-07 – 2015-01-09 (×6): 1 via ORAL

## 2015-01-07 MED ORDER — ESCITALOPRAM OXALATE 10 MG PO TABS
10.0000 mg | ORAL_TABLET | Freq: Every day | ORAL | Status: DC
  Administered 2015-01-08 – 2015-01-09 (×2): 10 mg via ORAL
  Filled 2015-01-07 (×2): qty 1

## 2015-01-07 NOTE — Progress Notes (Signed)
Brief Nutrition Follow-Up Note  Chart reviewed. GI recommending laparoscopy, however, pt's insurance will not cover unless surgery is a true emergency. Therefore, surgery has been held and pt has been advanced to a clear liquid diet.   RD will add Boost Breeze po TID, each supplement provides 250 kcal and 9 grams of protein, due to diet advancement.   RD will continue to follow.   Kaleigh Spiegelman A. Mayford Knife, RD, LDN, CDE Pager: [phone] After hours Pager: [phone]

## 2015-01-07 NOTE — Progress Notes (Signed)
Patient ID: Shelby Ortiz, female   DOB: 11-23-1980, 34 y.o.   MRN: [id number]  Apparently, [facility] is out of network with the patient's insurance plan so she would not be covered for surgery unless is was a true emergency, which this is not.  We will have to hold on surgery here for now. I will go ahead and let her have clear liquids to see if she tolerates it. Clearly, however, I do believe that she needs surgery to figure out why she continues to develop this obstructive pattern.

## 2015-01-07 NOTE — Progress Notes (Signed)
Patient ID: Shelby Ortiz, female   DOB: December 08, 1980, 34 y.o.   MRN: [id number] [facility] Progress Note  [place] 34 y.o. [birth year]   Subjective: Feels ok. No abdominal pain at this time. Mother and husband at bedside.  Objective: Vital signs in last 24 hours: Filed Vitals:   01/07/15 0611  BP: 122/91  Pulse: 71  Temp: 98.1 F (36.7 C)  Resp: 18    Physical Exam: Gen: alert, no acute distress CV: RRR Chest: CTA B Abd: minimal epigastric and periumbilical tenderness with guarding, soft, nondistended, +BS  Lab Results:  Recent Labs  01/06/15 0334 01/07/15 0420  NA 137 140  K 4.1 4.2  CL 105 109  CO2 24 25  GLUCOSE 104* 97  BUN 7 7  CREATININE 0.82 0.73  CALCIUM 8.4* 8.6*    Recent Labs  01/05/15 1338 01/06/15 0334  AST 14* 12*  ALT 16 13*  ALKPHOS 97 80  BILITOT 0.4 0.4  PROT 6.5 4.9*  ALBUMIN 3.0* 2.4*    Recent Labs  01/06/15 0334 01/07/15 0420  WBC 5.8 5.1  HGB 11.1* 10.8*  HCT 34.6* 33.7*  MCV 82.8 82.4  PLT 294 273   No results for input(s): LABPROT, INR in the last 72 hours.    Assessment/Plan: Recurrent small bowel obstruction of unknown etiology. No Crohn's evidence on recent colonoscopy and no active inflammation seen on CTs. Agree that she needs a laparoscopy to help determine the source of her SBO. She states that she has agreed to do the surgery but has an issue with her insurance that case manager is coming by to try and rectify. On IV steroids. Appreciate Dr. Eliberto Ivory assistance. Dr. Ewing Schlein available to see this weekend if necessary. Will sign off. Call us back if questions arise.   Mikeala Girdler C. 01/07/2015, 9:40 AM  Pager [phone]  If no answer or after 5 PM call [phone]

## 2015-01-07 NOTE — Progress Notes (Signed)
CM consult for questions about insurance coverage.  Apparently, Dr. Magnus Ivan has investigated this issue this morning, and has cancelled surgery.  Insurance states they will not cover, as medical issue is not emergent, per bedside nurse.  Please re-consult case manager for any additional needs.    Quintella Baton, RN, BSN  Trauma/Neuro ICU Case Manager [phone]

## 2015-01-07 NOTE — Progress Notes (Signed)
[facility] PROGRESS NOTE  Shelby Ortiz:[id number] DOB: [birth year] DOA: 01/05/2015  PCP: Leanor Rubenstein, MD  Brief HPI: 34 year old Caucasian female with past medical history of depression, anxiety, recent small bowel obstruction who presented with abdominal pain. She was hospitalized in May for similar complaints. There was a concern that she may have Crohn's disease. She was discharged on steroids. She apparently underwent a colonoscopy 3 weeks ago and biopsies did not suggest Crohn's disease. CT scan done yesterday revealed evidence for small bowel obstruction with a transition point at distal ileum. She was hospitalized for further management.  Past medical history:  Past Medical History  Diagnosis Date  . Anxiety   . Depression   . Dysmenorrhea   . IUD (intrauterine device) in place 02/2013    skyla  . SBO (small bowel obstruction) 09/2014; 01/05/2015  . Ocular migraine     "very rare" (01/05/2015)  . Migraine with aura     "maybe monthly, if that" (01/05/2015)    Consultants: Gen. surgery  Procedures: None  Antibiotics: None  Subjective: Patient continues to feel well. Had bowel movements yesterday. Passing gas. No nausea, vomiting. Abdomen is not as distended.   Objective: Vital Signs  Filed Vitals:   01/06/15 1435 01/06/15 1700 01/06/15 2218 01/07/15 0611  BP: 118/84  109/82 122/91  Pulse: 59  61 71  Temp:  97.8 F (36.6 C) 98.9 F (37.2 C) 98.1 F (36.7 C)  TempSrc:  Oral Oral Oral  Resp: 18  18 18   SpO2: 94%  97% 97%    Intake/Output Summary (Last 24 hours) at 01/07/15 0818 Last data filed at 01/07/15 0600  Gross per 24 hour  Intake 2056.66 ml  Output      3 ml  Net 2053.66 ml   There were no vitals filed for this visit.  General appearance: alert, cooperative, appears stated age and no distress Resp: clear to auscultation bilaterally Cardio: regular rate and rhythm, S1, S2 normal, no murmur, click, rub or gallop GI: soft, non-tender;  nondistended. bowel sounds normal; no masses,  no organomegaly. No change in examination Extremities: extremities normal, atraumatic, no cyanosis or edema Neurologic: No focal deficits.  Lab Results:  Basic Metabolic Panel:  Recent Labs Lab 01/05/15 1338 01/06/15 0334 01/07/15 0420  NA 134* 137 140  K 4.3 4.1 4.2  CL 101 105 109  CO2 22 24 25   GLUCOSE 132* 104* 97  BUN 6 7 7   CREATININE 0.77 0.82 0.73  CALCIUM 9.0 8.4* 8.6*   Liver Function Tests:  Recent Labs Lab 01/05/15 1338 01/06/15 0334  AST 14* 12*  ALT 16 13*  ALKPHOS 97 80  BILITOT 0.4 0.4  PROT 6.5 4.9*  ALBUMIN 3.0* 2.4*    Recent Labs Lab 01/05/15 1338  LIPASE 14*   CBC:  Recent Labs Lab 01/05/15 1338 01/06/15 0334 01/07/15 0420  WBC 9.1 5.8 5.1  HGB 13.0 11.1* 10.8*  HCT 39.3 34.6* 33.7*  MCV 80.9 82.8 82.4  PLT 311 294 273    Studies/Results: Ct Entero Abd/pelvis W/cm  01/05/2015   CLINICAL DATA:  Diarrhea. Followup of small bowel inflammation. Generalized abdominal pain and nausea. Recent hospitalization for small bowel obstruction. Negative colonoscopy 1 month ago.  EXAM: CT ABDOMEN AND PELVIS WITH CONTRAST (ENTEROGRAPHY)  TECHNIQUE: Multidetector CT of the abdomen and pelvis during bolus administration of intravenous contrast. Negative oral contrast VoLumen was given.  CONTRAST:  ISOVUE-300 IOPAMIDOL (ISOVUE-300) INJECTION 61%  COMPARISON:  10/26/2014 and 10/19/2014.  FINDINGS:  Lower chest: Clear lung bases. Normal heart size without pericardial or pleural effusion. Tiny hiatal hernia.  Hepatobiliary: Focal steatosis adjacent the falciform ligament. Multiple gallstones without acute cholecystitis or biliary duct dilatation.  Pancreas: Normal, without mass or ductal dilatation.  Spleen: Normal  Adrenals/Urinary Tract: Normal adrenal glands. Normal kidneys, without hydronephrosis. Normal urinary bladder.  Stomach/Bowel: Normal remainder of the stomach. Colon is relatively decompressed.  Submucosal fat prominence within is nonspecific.  The terminal ileum is normal in caliber, without inflammation on image 102 of series 3. The extent of small bowel distension with neutral contrast is good. There is persistent moderate small bowel distention, within example loop measuring up to 4.7 cm on the left side of the abdomen. 5.8 cm at the same level on 10/26/2014. Small bowel distension continues to the terminal ileum, where transition is again identified, including on images 99 through 102 of series 3. Also coronal images 39-52. No obstructing mass identified. No evidence of active bowel inflammation.  Vascular/Lymphatic: Normal caliber of the aorta and branch vessels. No retroperitoneal or retrocrural adenopathy. No pelvic sidewall adenopathy. Mildly prominent ileocolic mesenteric nodes are likely reactive.  Reproductive: Intrauterine device.  No adnexal mass.  Other: No significant free fluid.  Musculoskeletal: Mild convex right lumbar spine curvature. Degenerative disc disease at the lumbosacral junction with mild L4-5 disc bulge.  IMPRESSION: 1. Moderate small bowel obstruction, with transition again identified at the distal ileum. No obstructive mass identified. 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 2. Cholelithiasis.   Electronically Signed   By: Jeronimo Greaves M.D.   On: 01/05/2015 12:34    Medications:  Scheduled: . enoxaparin (LOVENOX) injection  40 mg Subcutaneous Q24H  . folic acid  1 mg Intravenous Daily  . methylPREDNISolone (SOLU-MEDROL) injection  40 mg Intravenous Q12H  . pantoprazole (PROTONIX) IV  40 mg Intravenous Q24H  . sodium chloride  3 mL Intravenous Q12H  . thiamine  100 mg Intravenous Daily   Continuous: . sodium chloride 100 mL/hr at 01/06/15 2313   Ortiz:WRUEAVWU injection, ondansetron (ZOFRAN) IV, promethazine  Assessment/Plan:  Principal Problem:   Small bowel obstruction Active Problems:   Anxiety   Depression   Dehydration with  hyponatremia   SBO (small bowel obstruction)    Small bowel obstruction Patient seems to be improving. General surgery was consulted yesterday and they feel that the patient may benefit from laparoscopy to diagnose what is causing her recurrent obstruction. Apparently there are insurance issues at play. Surgery has been held for now. She has been placed on clear liquids. Gastroenterology is following. Recent colonoscopy and biopsy reports did not suggest Crohn's. Cutback on steroids dose.   Incidental cholelithiasis Stable. LFTs are normal.  History of anxiety and depression Resume home medications when she is able to take orally  DVT Prophylaxis: Lovenox    Code Status: Full code  Family Communication: Discussed with the patient and her mother  Disposition Plan: Will return home when medically improved. Surgery on hold due to insurance issues.     LOS: 2 days   [facility]  [facility] Pager [phone] 01/07/2015, 8:18 AM  If 7PM-7AM, please contact night-coverage at [email or web address], password [facility]

## 2015-01-07 NOTE — Progress Notes (Signed)
Subjective: No abdominal pain this morning Denies nausea  Objective: Vital signs in last 24 hours: Temp:  [97.8 F (36.6 C)-98.9 F (37.2 C)] 98.1 F (36.7 C) (07/15 0611) Pulse Rate:  [59-71] 71 (07/15 0611) Resp:  [18] 18 (07/15 0611) BP: (109-122)/(82-91) 122/91 mmHg (07/15 0611) SpO2:  [94 %-97 %] 97 % (07/15 0611) Last BM Date: 01/05/15  Intake/Output from previous day: 07/14 0701 - 07/15 0700 In: 2056.7 [I.V.:2056.7] Out: 3 [Urine:3] Intake/Output this shift:    Abdomen soft, non tender  Lab Results:   Recent Labs  01/06/15 0334 01/07/15 0420  WBC 5.8 5.1  HGB 11.1* 10.8*  HCT 34.6* 33.7*  PLT 294 273   BMET  Recent Labs  01/06/15 0334 01/07/15 0420  NA 137 140  K 4.1 4.2  CL 105 109  CO2 24 25  GLUCOSE 104* 97  BUN 7 7  CREATININE 0.82 0.73  CALCIUM 8.4* 8.6*   PT/INR No results for input(s): LABPROT, INR in the last 72 hours. ABG No results for input(s): PHART, HCO3 in the last 72 hours.  Invalid input(s): PCO2, PO2  Studies/Results: Ct Entero Abd/pelvis W/cm  01/05/2015   CLINICAL DATA:  Diarrhea. Followup of small bowel inflammation. Generalized abdominal pain and nausea. Recent hospitalization for small bowel obstruction. Negative colonoscopy 1 month ago.  EXAM: CT ABDOMEN AND PELVIS WITH CONTRAST (ENTEROGRAPHY)  TECHNIQUE: Multidetector CT of the abdomen and pelvis during bolus administration of intravenous contrast. Negative oral contrast VoLumen was given.  CONTRAST:  ISOVUE-300 IOPAMIDOL (ISOVUE-300) INJECTION 61%  COMPARISON:  10/26/2014 and 10/19/2014.  FINDINGS: Lower chest: Clear lung bases. Normal heart size without pericardial or pleural effusion. Tiny hiatal hernia.  Hepatobiliary: Focal steatosis adjacent the falciform ligament. Multiple gallstones without acute cholecystitis or biliary duct dilatation.  Pancreas: Normal, without mass or ductal dilatation.  Spleen: Normal  Adrenals/Urinary Tract: Normal adrenal glands.  Normal kidneys, without hydronephrosis. Normal urinary bladder.  Stomach/Bowel: Normal remainder of the stomach. Colon is relatively decompressed. Submucosal fat prominence within is nonspecific.  The terminal ileum is normal in caliber, without inflammation on image 102 of series 3. The extent of small bowel distension with neutral contrast is good. There is persistent moderate small bowel distention, within example loop measuring up to 4.7 cm on the left side of the abdomen. 5.8 cm at the same level on 10/26/2014. Small bowel distension continues to the terminal ileum, where transition is again identified, including on images 99 through 102 of series 3. Also coronal images 39-52. No obstructing mass identified. No evidence of active bowel inflammation.  Vascular/Lymphatic: Normal caliber of the aorta and branch vessels. No retroperitoneal or retrocrural adenopathy. No pelvic sidewall adenopathy. Mildly prominent ileocolic mesenteric nodes are likely reactive.  Reproductive: Intrauterine device.  No adnexal mass.  Other: No significant free fluid.  Musculoskeletal: Mild convex right lumbar spine curvature. Degenerative disc disease at the lumbosacral junction with mild L4-5 disc bulge.  IMPRESSION: 1. Moderate small bowel obstruction, with transition again identified at the distal ileum. No obstructive mass identified. 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 2. Cholelithiasis.   Electronically Signed   By: Jeronimo Greaves M.D.   On: 01/05/2015 12:34    Anti-infectives: Anti-infectives    None      Assessment/Plan:  Small bowel obstruction   Etiology remains unclear.  This, however, is her 4th episode this year and she has been significantly symptomatic as well as losing weight. In my opinion, she needs a diagnostic laparoscopy,  possible laparotomy with possible bowel resection depending on the operative findings.  Currently, she is worried about the insurance implications and is  going to try to get clarification prior to surgery.  Hopefully, we will be able to proceed today.  LOS: 2 days    Ivalee Strauser A 01/07/2015

## 2015-01-07 NOTE — Progress Notes (Signed)
Utilization review completed. Dawna Jakes, RN, BSN. 

## 2015-01-08 LAB — BASIC METABOLIC PANEL
Anion gap: 3 — ABNORMAL LOW (ref 5–15)
BUN: 5 mg/dL — ABNORMAL LOW (ref 6–20)
CO2: 29 mmol/L (ref 22–32)
Calcium: 8.6 mg/dL — ABNORMAL LOW (ref 8.9–10.3)
Chloride: 110 mmol/L (ref 101–111)
Creatinine, Ser: 0.83 mg/dL (ref 0.44–1.00)
GFR calc non Af Amer: >60 mL/min (ref >60)
GLUCOSE: 109 mg/dL — AB (ref 65–99)
POTASSIUM: 4.1 mmol/L (ref 3.5–5.1)
Sodium: 142 mmol/L (ref 135–145)

## 2015-01-08 LAB — CBC
HCT: 36.2 % (ref 36.0–46.0)
HEMOGLOBIN: 11.4 g/dL — AB (ref 12.0–15.0)
MCH: 26.3 pg (ref 26.0–34.0)
MCHC: 31.5 g/dL (ref 30.0–36.0)
MCV: 83.4 fL (ref 78.0–100.0)
PLATELETS: 260 10*3/uL (ref 150–400)
RBC: 4.34 MIL/uL (ref 3.87–5.11)
RDW: 15.2 % (ref 11.5–15.5)
WBC: 5.8 10*3/uL (ref 4.0–10.5)

## 2015-01-08 MED ORDER — ADULT MULTIVITAMIN W/MINERALS CH
1.0000 | ORAL_TABLET | Freq: Every day | ORAL | Status: DC
  Administered 2015-01-08 – 2015-01-09 (×2): 1 via ORAL
  Filled 2015-01-08 (×2): qty 1

## 2015-01-08 MED ORDER — PANTOPRAZOLE SODIUM 40 MG PO TBEC
40.0000 mg | DELAYED_RELEASE_TABLET | Freq: Every evening | ORAL | Status: DC
  Administered 2015-01-08 – 2015-01-09 (×2): 40 mg via ORAL
  Filled 2015-01-08 (×2): qty 1

## 2015-01-08 MED ORDER — MORPHINE SULFATE 2 MG/ML IJ SOLN: 1.0000 mg | INTRAMUSCULAR | Status: DC | PRN

## 2015-01-08 NOTE — Progress Notes (Signed)
Patient ID: Shelby Ortiz, female   DOB: 06/20/1981, 34 y.o.   MRN: [id number]  General Surgery [facility]  Subjective: Patient comfortable, family at bedside.  No nausea or emesis.  No pain.  Tolerating clear liquid diet.  Objective: Vital signs in last 24 hours: Temp:  [97.8 F (36.6 C)-98.2 F (36.8 C)] 98.2 F (36.8 C) (07/16 0449) Pulse Rate:  [73-79] 74 (07/16 0449) Resp:  [18-19] 18 (07/16 0449) BP: (110-120)/(66-88) 120/72 mmHg (07/16 0449) SpO2:  [97 %-98 %] 98 % (07/16 0449) Last BM Date: 01/06/15  Intake/Output from previous day: 07/15 0701 - 07/16 0700 In: 1455 [I.V.:1455] Out: 3 [Urine:2; Stool:1] Intake/Output this shift:    Physical Exam: HEENT - sclerae clear, mucous membranes moist Neck - soft Chest - clear bilaterally Cor - RRR Abdomen - soft, protuberant, non-tender Ext - no edema, non-tender Neuro - alert & oriented, no focal deficits  Lab Results:   Recent Labs  01/07/15 0420 01/08/15 0453  WBC 5.1 5.8  HGB 10.8* 11.4*  HCT 33.7* 36.2  PLT 273 260   BMET  Recent Labs  01/07/15 0420 01/08/15 0453  NA 140 142  K 4.2 4.1  CL 109 110  CO2 25 29  GLUCOSE 97 109*  BUN 7 5*  CREATININE 0.73 0.83  CALCIUM 8.6* 8.6*   PT/INR No results for input(s): LABPROT, INR in the last 72 hours. Comprehensive Metabolic Panel:    Component Value Date/Time   NA 142 01/08/2015 0453   NA 140 01/07/2015 0420   K 4.1 01/08/2015 0453   K 4.2 01/07/2015 0420   CL 110 01/08/2015 0453   CL 109 01/07/2015 0420   CO2 29 01/08/2015 0453   CO2 25 01/07/2015 0420   BUN 5* 01/08/2015 0453   BUN 7 01/07/2015 0420   CREATININE 0.83 01/08/2015 0453   CREATININE 0.73 01/07/2015 0420   GLUCOSE 109* 01/08/2015 0453   GLUCOSE 97 01/07/2015 0420   CALCIUM 8.6* 01/08/2015 0453   CALCIUM 8.6* 01/07/2015 0420   AST 12* 01/06/2015 0334   AST 14* 01/05/2015 1338   ALT 13* 01/06/2015 0334   ALT 16 01/05/2015 1338   ALKPHOS 80 01/06/2015  0334   ALKPHOS 97 01/05/2015 1338   BILITOT 0.4 01/06/2015 0334   BILITOT 0.4 01/05/2015 1338   PROT 4.9* 01/06/2015 0334   PROT 6.5 01/05/2015 1338   ALBUMIN 2.4* 01/06/2015 0334   ALBUMIN 3.0* 01/05/2015 1338    Studies/Results: No results found.  Anti-infectives: Anti-infectives    None      Assessment & Plans: Chronic SBO  Clear liquid diet  Patient out of network with [facility] [employer] product - surgeons and anesthesia not covered here  Anticipating transfer to another facility that is in-network for surgery  Will follow - plan to see Monday if still here  Velora Heckler, MD, [facility] Office: [phone]   Eston Heslin Judie Petit 01/08/2015

## 2015-01-08 NOTE — Progress Notes (Signed)
[facility] PROGRESS NOTE  Shelby Ortiz GEF:[id number] DOB: Oct 07, 1980 DOA: 01/05/2015  PCP: Shelby Rubenstein, MD  Brief HPI: 34 year old Caucasian female with past medical history of depression, anxiety, recent small bowel obstruction who presented with abdominal pain. She was hospitalized in May for similar complaints. There was a concern that she may have Crohn's disease. She was discharged on steroids. She apparently underwent a colonoscopy 3 weeks ago and biopsies did not suggest Crohn's disease. CT scan done yesterday revealed evidence for small bowel obstruction with a transition point at distal ileum. She was hospitalized for further management.  Past medical history:  Past Medical History  Diagnosis Date  . Anxiety   . Depression   . Dysmenorrhea   . IUD (intrauterine device) in place 02/2013    skyla  . SBO (small bowel obstruction) 09/2014; 01/05/2015  . Ocular migraine     "very rare" (01/05/2015)  . Migraine with aura     "maybe monthly, if that" (01/05/2015)    Consultants: Gen. surgery  Procedures: None  Antibiotics: None  Subjective: Patient feels well. Tolerating her liquid diet. Denies any nausea, vomiting. Passing gas.   Objective: Vital Signs  Filed Vitals:   01/07/15 0611 01/07/15 1327 01/07/15 2114 01/08/15 0449  BP: 122/91 110/66 116/88 120/72  Pulse: 71 79 73 74  Temp: 98.1 F (36.7 C) 97.8 F (36.6 C) 97.9 F (36.6 C) 98.2 F (36.8 C)  TempSrc: Oral Oral Oral Oral  Resp: 18 19 18 18   SpO2: 97% 97% 98% 98%    Intake/Output Summary (Last 24 hours) at 01/08/15 1004 Last data filed at 01/08/15 0846  Gross per 24 hour  Intake   1815 ml  Output      3 ml  Net   1812 ml   There were no vitals filed for this visit.  General appearance: alert, cooperative, appears stated age and no distress Resp: clear to auscultation bilaterally Cardio: regular rate and rhythm, S1, S2 normal, no murmur, click, rub or gallop GI: soft, non-tender;  nondistended. bowel sounds normal; no masses,  no organomegaly. Extremities: extremities normal, atraumatic, no cyanosis or edema Neurologic: No focal deficits.  Lab Results:  Basic Metabolic Panel:  Recent Labs Lab 01/05/15 1338 01/06/15 0334 01/07/15 0420 01/08/15 0453  NA 134* 137 140 142  K 4.3 4.1 4.2 4.1  CL 101 105 109 110  CO2 22 24 25 29   GLUCOSE 132* 104* 97 109*  BUN 6 7 7  5*  CREATININE 0.77 0.82 0.73 0.83  CALCIUM 9.0 8.4* 8.6* 8.6*   Liver Function Tests:  Recent Labs Lab 01/05/15 1338 01/06/15 0334  AST 14* 12*  ALT 16 13*  ALKPHOS 97 80  BILITOT 0.4 0.4  PROT 6.5 4.9*  ALBUMIN 3.0* 2.4*    Recent Labs Lab 01/05/15 1338  LIPASE 14*   CBC:  Recent Labs Lab 01/05/15 1338 01/06/15 0334 01/07/15 0420 01/08/15 0453  WBC 9.1 5.8 5.1 5.8  HGB 13.0 11.1* 10.8* 11.4*  HCT 39.3 34.6* 33.7* 36.2  MCV 80.9 82.8 82.4 83.4  PLT 311 294 273 260    Studies/Results: No results found.  Medications:  Scheduled: . enoxaparin (LOVENOX) injection  40 mg Subcutaneous Q24H  . escitalopram  10 mg Oral Daily  . feeding supplement  1 Container Oral TID BM  . methylPREDNISolone (SOLU-MEDROL) injection  40 mg Intravenous Q12H  . multivitamin with minerals  1 tablet Oral Daily  . pantoprazole  40 mg Oral QPM  . sodium chloride  3 mL Intravenous Q12H   Continuous: . sodium chloride 100 mL/hr at 01/08/15 0410   WUJ:WJXBJYNW injection, ondansetron (ZOFRAN) IV, promethazine  Assessment/Plan:  Principal Problem:   Small bowel obstruction Active Problems:   Anxiety   Depression   Dehydration with hyponatremia   SBO (small bowel obstruction)    Small bowel obstruction Patient seems to be improving. General surgery was consulted and they feel that the patient may benefit from laparoscopy to diagnose what is causing her recurrent obstruction. Apparently there are insurance issues at play. As a result, surgery has been canceled, as there is no emergent  condition present. Patient is tolerating clear liquids. She'll be advanced to full liquids today. Recent colonoscopy and biopsy reports did not suggest Crohn's. Discussed with Dr. Magnus Ivan July 15. Since the patient is improving he recommends that she follow-up with a surgeon who is in network with respect to patient's insurance. She could be discharged once she is tolerating a diet. We will continue her steroids for now.  Incidental cholelithiasis Stable. LFTs are normal.  History of anxiety and depression Continue home medications  DVT Prophylaxis: Lovenox    Code Status: Full code  Family Communication: Discussed with the patient and her mother  Disposition Plan: Advance to full liquids today. Anticipate discharge tomorrow on oral steroids. Patient already in the process of identifying surgeons who are in network. She will need to follow-up with her gastroenterologist as well.     LOS: 3 days   [facility]  [facility] Pager [phone] 01/08/2015, 10:04 AM  If 7PM-7AM, please contact night-coverage at [email or web address], password [facility]

## 2015-01-09 MED ORDER — PREDNISONE 20 MG PO TABS: 20.0000 mg | ORAL_TABLET | Freq: Two times a day (BID) | ORAL | Status: DC

## 2015-01-09 NOTE — Progress Notes (Signed)
Pt discharged with mom to home.  DC instructions given and explained.  Copy given.  Rx for prednisone given and explained.  Pt is to follow up with Dr. Bosie Clos 1 week, # given and pt is to follow up with her insurance to find surgeon in network.

## 2015-01-09 NOTE — Discharge Instructions (Signed)
Soft-Food Meal Plan °A soft-food meal plan includes foods that are safe and easy to swallow. This meal plan typically is used: °· If you are having trouble chewing or swallowing foods. °· As a transition meal plan after only having had liquid meals for a long period. °WHAT DO I NEED TO KNOW ABOUT THE SOFT-FOOD MEAL PLAN? °A soft-food meal plan includes tender foods that are soft and easy to chew and swallow. In most cases, bite-sized pieces of food are easier to swallow. A bite-sized piece is about ½ inch or smaller. Foods in this plan do not need to be ground or pureed. °Foods that are very hard, crunchy, or sticky should be avoided. Also, breads, cereals, yogurts, and desserts with nuts, seeds, or fruits should be avoided. °WHAT FOODS CAN I EAT? °Grains °Rice and wild rice. Moist bread, dressing, pasta, and noodles. Well-moistened dry or cooked cereals, such as farina (cooked wheat cereal), oatmeal, or grits. Biscuits, breads, muffins, pancakes, and waffles that have been well moistened. °Vegetables °Shredded lettuce. Cooked, tender vegetables, including potatoes without skins. Vegetable juices. Broths or creamed soups made with vegetables that are not stringy or chewy. Strained tomatoes (without seeds). °Fruits °Canned or well-cooked fruits. Soft (ripe), peeled fresh fruits, such as peaches, nectarines, kiwi, cantaloupe, honeydew melon, and watermelon (without seeds). Soft berries with small seeds, such as strawberries. Fruit juices (without pulp). °Meats and Other Protein Sources °Moist, tender, lean beef. Mutton. Lamb. Veal. Chicken. Turkey. Liver. Ham. Fish without bones. Eggs. °Dairy °Milk, milk drinks, and cream. Plain cream cheese and cottage cheese. Plain yogurt. °Sweets/Desserts °Flavored gelatin desserts. Custard. Plain ice cream, frozen yogurt, sherbet, milk shakes, and malts. Plain cakes and cookies. Plain hard candy.  °Other °Butter, margarine (without trans fat), and cooking oils. Mayonnaise. Cream  sauces. Mild spices, salt, and sugar. Syrup, molasses, honey, and jelly. °The items listed above may not be a complete list of recommended foods or beverages. Contact your dietitian for more options. °WHAT FOODS ARE NOT RECOMMENDED? °Grains °Dry bread, toast, crackers that have not been moistened. Coarse or dry cereals, such as bran, granola, and shredded wheat. Tough or chewy crusty breads, such as French bread or baguettes. °Vegetables °Corn. Raw vegetables except shredded lettuce. Cooked vegetables that are tough or stringy. Tough, crisp, fried potatoes and potato skins. °Fruits °Fresh fruits with skins or seeds or both, such as apples, pears, or grapes. Stringy, high-pulp fruits, such as papaya, pineapple, coconut, or mango. Fruit leather, fruit roll-ups, and all dried fruits. °Meats and Other Protein Sources °Sausages and hot dogs. Meats with gristle. Fish with bones. Nuts, seeds, and chunky peanut or other nut butters. °Sweets/Desserts °Cakes or cookies that are very dry or chewy.  °The items listed above may not be a complete list of foods and beverages to avoid. Contact your dietitian for more information. °Document Released: 09/18/2007 Document Revised: 06/16/2013 Document Reviewed: 05/08/2013 °ExitCare® Patient Information ©2015 [employer]. This information is not intended to replace advice given to you by your health care provider. Make sure you discuss any questions you have with your health care provider. ° ° °

## 2015-01-09 NOTE — Discharge Summary (Signed)
[facility]  Physician Discharge Summary   Patient ID: Shelby Ortiz MRN: [id number] DOB/AGE: August 01, 1980 34 y.o.  Admit date: 01/05/2015 Discharge date: 01/09/2015  PCP: Leanor Rubenstein, MD  DISCHARGE DIAGNOSES:  Principal Problem:   Small bowel obstruction Active Problems:   Anxiety   Depression   Dehydration with hyponatremia   SBO (small bowel obstruction)   RECOMMENDATIONS FOR OUTPATIENT FOLLOW UP: 1. Patient in the process of determining which surgeons in this area are in network for her. She will need to follow-up for resolution to her problem. 2. She has been asked to continue with steroids for now. Her gastroenterologist to taper this as an outpatient.   DISCHARGE CONDITION: fair  Diet recommendation: Soft diet  INITIAL HISTORY: 34 year old Caucasian female with past medical history of depression, anxiety, recent small bowel obstruction who presented with abdominal pain. She was hospitalized in May for similar complaints. There was a concern that she may have Crohn's disease. She was discharged on steroids. She apparently underwent a colonoscopy 3 weeks ago and biopsies did not suggest Crohn's disease. CT scan revealed evidence for small bowel obstruction with a transition point at distal ileum. She was hospitalized for further management.  Consultations:  Gastroenterology and general surgery.   HOSPITAL COURSE:   Small bowel obstruction Patient has improved. She is tolerating soft diet. No nausea, vomiting. No abdominal cramps. General surgery was consulted after discussing CT findings with gastroenterology. Recent colonoscopy and biopsy report did not suggest Crohn's. Her recurrent symptoms do not have a clear explanation. Surgery was recommended for definitive diagnosis. However, apparently her insurance does not cover general surgery services by the [facility] surgery staff unless it was a true emergency. So surgery was canceled. Discussed with Dr.  Magnus Ivan July 15. Since the patient is improving he recommends that she follow-up with a surgeon who is in network with respect to patient's insurance. She is now trying to find surgeons in this area who will be able to operate on her within the scope of her insurance. Since she is clinically improved she is considered safe for discharge. She will continue with steroids for now. She will follow-up with her gastroenterologist for further management and if needed. Referral to a surgeon.   Incidental cholelithiasis Stable. LFTs are normal.  History of anxiety and depression Continue home medications  Okay for discharge today. Discussed in detail with the patient and her mother. All her questions were answered.  PERTINENT LABS:  The results of significant diagnostics from this hospitalization (including imaging, microbiology, ancillary and laboratory) are listed below for reference.    Microbiology: Recent Results (from the past 240 hour(s))  Surgical pcr screen     Status: None   Collection Time: 01/06/15  9:29 PM  Result Value Ref Range Status   MRSA, PCR NEGATIVE NEGATIVE Final   Staphylococcus aureus NEGATIVE NEGATIVE Final    Comment:        The Xpert SA Assay (FDA approved for NASAL specimens in patients over 54 years of age), is one component of a comprehensive surveillance program.  Test performance has been validated by [facility] for patients greater than or equal to 74 year old. It is not intended to diagnose infection nor to guide or monitor treatment.      Labs: Basic Metabolic Panel:  Recent Labs Lab 01/05/15 1338 01/06/15 0334 01/07/15 0420 01/08/15 0453  NA 134* 137 140 142  K 4.3 4.1 4.2 4.1  CL 101 105 109 110  CO2 22 24 25  29  GLUCOSE 132* 104* 97 109*  BUN 6 7 7  5*  CREATININE 0.77 0.82 0.73 0.83  CALCIUM 9.0 8.4* 8.6* 8.6*   Liver Function Tests:  Recent Labs Lab 01/05/15 1338 01/06/15 0334  AST 14* 12*  ALT 16 13*  ALKPHOS 97 80    BILITOT 0.4 0.4  PROT 6.5 4.9*  ALBUMIN 3.0* 2.4*    Recent Labs Lab 01/05/15 1338  LIPASE 14*   CBC:  Recent Labs Lab 01/05/15 1338 01/06/15 0334 01/07/15 0420 01/08/15 0453  WBC 9.1 5.8 5.1 5.8  HGB 13.0 11.1* 10.8* 11.4*  HCT 39.3 34.6* 33.7* 36.2  MCV 80.9 82.8 82.4 83.4  PLT 311 294 273 260    IMAGING STUDIES Ct Entero Abd/pelvis W/cm  01/05/2015   CLINICAL DATA:  Diarrhea. Followup of small bowel inflammation. Generalized abdominal pain and nausea. Recent hospitalization for small bowel obstruction. Negative colonoscopy 1 month ago.  EXAM: CT ABDOMEN AND PELVIS WITH CONTRAST (ENTEROGRAPHY)  TECHNIQUE: Multidetector CT of the abdomen and pelvis during bolus administration of intravenous contrast. Negative oral contrast VoLumen was given.  CONTRAST:  ISOVUE-300 IOPAMIDOL (ISOVUE-300) INJECTION 61%  COMPARISON:  10/26/2014 and 10/19/2014.  FINDINGS: Lower chest: Clear lung bases. Normal heart size without pericardial or pleural effusion. Tiny hiatal hernia.  Hepatobiliary: Focal steatosis adjacent the falciform ligament. Multiple gallstones without acute cholecystitis or biliary duct dilatation.  Pancreas: Normal, without mass or ductal dilatation.  Spleen: Normal  Adrenals/Urinary Tract: Normal adrenal glands. Normal kidneys, without hydronephrosis. Normal urinary bladder.  Stomach/Bowel: Normal remainder of the stomach. Colon is relatively decompressed. Submucosal fat prominence within is nonspecific.  The terminal ileum is normal in caliber, without inflammation on image 102 of series 3. The extent of small bowel distension with neutral contrast is good. There is persistent moderate small bowel distention, within example loop measuring up to 4.7 cm on the left side of the abdomen. 5.8 cm at the same level on 10/26/2014. Small bowel distension continues to the terminal ileum, where transition is again identified, including on images 99 through 102 of series 3. Also coronal  images 39-52. No obstructing mass identified. No evidence of active bowel inflammation.  Vascular/Lymphatic: Normal caliber of the aorta and branch vessels. No retroperitoneal or retrocrural adenopathy. No pelvic sidewall adenopathy. Mildly prominent ileocolic mesenteric nodes are likely reactive.  Reproductive: Intrauterine device.  No adnexal mass.  Other: No significant free fluid.  Musculoskeletal: Mild convex right lumbar spine curvature. Degenerative disc disease at the lumbosacral junction with mild L4-5 disc bulge.  IMPRESSION: 1. Moderate small bowel obstruction, with transition again identified at the distal ileum. No obstructive mass identified. 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 2. Cholelithiasis.   Electronically Signed   By: Jeronimo Greaves M.D.   On: 01/05/2015 12:34    DISCHARGE EXAMINATION: Filed Vitals:   01/08/15 0449 01/08/15 1402 01/08/15 2152 01/09/15 0528  BP: 120/72 110/71 114/73 112/87  Pulse: 74 67 69 61  Temp: 98.2 F (36.8 C) 98 F (36.7 C) 98.1 F (36.7 C) 98.4 F (36.9 C)  TempSrc: Oral Oral Oral   Resp: 18 18 18 17   SpO2: 98% 98% 97% 99%   General appearance: alert, cooperative, appears stated age and no distress Resp: clear to auscultation bilaterally Cardio: regular rate and rhythm, S1, S2 normal, no murmur, click, rub or gallop GI: soft, non-tender; bowel sounds normal; no masses,  no organomegaly Extremities: extremities normal, atraumatic, no cyanosis or edema  DISPOSITION: Home  Discharge  Instructions    Call MD for:  extreme fatigue    Complete by:  As directed      Call MD for:  persistant dizziness or light-headedness    Complete by:  As directed      Call MD for:  persistant nausea and vomiting    Complete by:  As directed      Call MD for:  severe uncontrolled pain    Complete by:  As directed      Call MD for:  temperature >100.4    Complete by:  As directed      Discharge diet:    Complete by:  As directed   Soft  Diet     Discharge instructions    Complete by:  As directed   Please follow up with Dr. Bosie Clos for further management and referral to a surgeon.  You were cared for by a hospitalist during your hospital stay. If you have any questions about your discharge medications or the care you received while you were in the hospital after you are discharged, you can call the unit and asked to speak with the hospitalist on call if the hospitalist that took care of you is not available. Once you are discharged, your primary care physician will handle any further medical issues. Please note that NO REFILLS for any discharge medications will be authorized once you are discharged, as it is imperative that you return to your primary care physician (or establish a relationship with a primary care physician if you do not have one) for your aftercare needs so that they can reassess your need for medications and monitor your lab values. If you do not have a primary care physician, you can call [phone] for a physician referral.     Increase activity slowly    Complete by:  As directed            ALLERGIES:  Allergies  Allergen Reactions  . Tramadol Nausea And Vomiting  . Sudafed [Pseudoephedrine Hcl] Anxiety    Shaking      Current Discharge Medication List    CONTINUE these medications which have CHANGED   Details  predniSONE (DELTASONE) 20 MG tablet Take 1 tablet (20 mg total) by mouth 2 (two) times daily with a meal. Starting tonight. Qty: 60 tablet, Refills: 0      CONTINUE these medications which have NOT CHANGED   Details  Aspirin-Acetaminophen-Caffeine (EXCEDRIN PO) Take 1 tablet by mouth daily as needed (for headache).     BISACODYL 5 MG EC tablet Take 5 mg by mouth 2 (two) times daily. Refills: 0    calcium carbonate (TUMS - DOSED IN MG ELEMENTAL CALCIUM) 500 MG chewable tablet Chew 1 tablet by mouth 2 (two) times daily with a meal. heartburn    clonazePAM (KLONOPIN) 0.5 MG tablet Take  0.5 mg by mouth as needed for anxiety.    escitalopram (LEXAPRO) 10 MG tablet Take 10 mg by mouth daily.    Levonorgestrel (SKYLA) 13.5 MG IUD by Intrauterine route.    Multiple Vitamins-Minerals (MULTIVITAMIN PO) Take 1 tablet by mouth daily.     ondansetron (ZOFRAN-ODT) 4 MG disintegrating tablet Take 1 tablet (4 mg total) by mouth every 6 (six) hours as needed for nausea or vomiting. Qty: 30 tablet, Refills: 0    pantoprazole (PROTONIX) 40 MG tablet Take 1 tablet (40 mg total) by mouth daily. Qty: 30 tablet, Refills: 4    simethicone (GAS-X) 80 MG chewable tablet Chew 2 tablets (  160 mg total) by mouth 4 (four) times daily as needed for flatulence (also available over the counter). Qty: 60 tablet, Refills: 1    clidinium-chlordiazePOXIDE (LIBRAX) 5-2.5 MG per capsule Take 1 capsule by mouth 4 (four) times daily as needed (for panic attack).        Follow-up Information    Call Leanor Rubenstein, MD.   Specialty:  Family Medicine   Why:  As needed   Contact information:   [address] [place] [address] [phone]       Follow up with Shirley Friar., MD. Schedule an appointment as soon as possible for a visit in 1 week.   Specialty:  Gastroenterology   Why:  post hospitalization follow up and for referral to "in-network" surgeon   Contact information:   [address] [place] [address] [phone]       TOTAL DISCHARGE TIME: 35 mins  [facility]  [facility] Pager [phone]  01/09/2015, 1:23 PM

## 2015-03-02 ENCOUNTER — Ambulatory Visit: Payer: No Typology Code available for payment source | Admitting: Gynecology

## 2015-03-10 ENCOUNTER — Ambulatory Visit: Payer: No Typology Code available for payment source | Admitting: Obstetrics and Gynecology

## 2015-05-26 DIAGNOSIS — K50012 Crohn's disease of small intestine with intestinal obstruction: Secondary | ICD-10-CM | POA: Insufficient documentation

## 2015-08-31 ENCOUNTER — Ambulatory Visit: Payer: No Typology Code available for payment source | Admitting: Obstetrics and Gynecology

## 2015-09-14 ENCOUNTER — Telehealth: Payer: Self-pay | Admitting: Obstetrics and Gynecology

## 2015-09-14 ENCOUNTER — Encounter: Payer: Self-pay | Admitting: Obstetrics and Gynecology

## 2015-09-14 ENCOUNTER — Ambulatory Visit (INDEPENDENT_AMBULATORY_CARE_PROVIDER_SITE_OTHER): Payer: BLUE CROSS/BLUE SHIELD | Admitting: Obstetrics and Gynecology

## 2015-09-14 VITALS — BP 110/70 | HR 80 | Resp 16 | Ht 61.25 in | Wt 143.0 lb

## 2015-09-14 DIAGNOSIS — Z30431 Encounter for routine checking of intrauterine contraceptive device: Secondary | ICD-10-CM

## 2015-09-14 DIAGNOSIS — N941 Unspecified dyspareunia: Secondary | ICD-10-CM | POA: Diagnosis not present

## 2015-09-14 DIAGNOSIS — Z01419 Encounter for gynecological examination (general) (routine) without abnormal findings: Secondary | ICD-10-CM | POA: Diagnosis not present

## 2015-09-14 DIAGNOSIS — Z30433 Encounter for removal and reinsertion of intrauterine contraceptive device: Secondary | ICD-10-CM

## 2015-09-14 NOTE — Telephone Encounter (Signed)
Orders placed for Sklya removal and Kyleena insertion. This will need to be performed prior to 03/02/2016.

## 2015-09-14 NOTE — Progress Notes (Signed)
Patient ID: Shelby Ortiz, female   DOB: [birth year], 35 y.o.   MRN: [id number] 35 y.o. [id number] MarriedCaucasianF here for annual exam. Patient c/o painful intercourse   She has a skyla IUD, due for removal in 9/17. She wasn't sexually active last year second to medical issues. She was sexually active for the first time in January, very painful at the opening, felt like tearing. Unable to finish secondary to discomfort. She didn't use a lubricant. Last year she had a small bowel obstruction, lost 45 lbs over 2 months. She was initially treated for IBS, ultimately had a CT where the bowel obstruction was diagnosed. She was hospitalized and released without surgery. Ultimately she had laparoscopic bowel resection. She had an ostomy bag 3 months. In December she had another surgery and the ostomy was reattached. She may have Crohns, not definitively diagnosed at this time. Clinically c/w Crohn's, but not histologically.  No cycles with the skyla.       No LMP recorded. Patient is not currently having periods (Reason: IUD).          Sexually active: Yes.    The current method of family planning is IUD.    Exercising: Yes.    walking and hiking Smoker:  no  Health Maintenance: Pap:  01-15-13 WNL NEG HR HPV History of abnormal Pap:  no MMG:  Never Colonoscopy:  09-06-15 WNL BMD:   Never TDaP:  2011 Gardasil: N/A   reports that she has never smoked. She has never used smokeless tobacco. She reports that she drinks about 1.0 oz of alcohol per week. She reports that she does not use illicit drugs.  Past Medical History  Diagnosis Date  . Anxiety   . Depression   . Dysmenorrhea   . IUD (intrauterine device) in place 02/2013    skyla  . SBO (small bowel obstruction) (HCC) 09/2014; 01/05/2015  . Ocular migraine     "very rare" (01/05/2015)  . Migraine with aura     "maybe monthly, if that" (01/05/2015)    Past Surgical History  Procedure Laterality Date  . Wisdom tooth extraction  ~ 2000   "all 4"  . Refractive surgery Bilateral 2000  . Small intestine surgery  2016   2 bowel surgeries  Current Outpatient Prescriptions  Medication Sig Dispense Refill  . Aspirin-Acetaminophen-Caffeine (EXCEDRIN PO) Take 1 tablet by mouth daily as needed (for headache).     . clonazePAM (KLONOPIN) 0.5 MG tablet Take 0.5 mg by mouth daily as needed for anxiety.     Marland Kitchen escitalopram (LEXAPRO) 10 MG tablet Take 10 mg by mouth daily.    . Levonorgestrel (SKYLA) 13.5 MG IUD by Intrauterine route.    . Multiple Vitamins-Minerals (MULTIVITAMIN PO) Take 1 tablet by mouth daily.     . ondansetron (ZOFRAN-ODT) 4 MG disintegrating tablet Take 1 tablet (4 mg total) by mouth every 6 (six) hours as needed for nausea or vomiting. 30 tablet 0  . pantoprazole (PROTONIX) 40 MG tablet Take 1 tablet (40 mg total) by mouth daily. (Patient taking differently: Take 40 mg by mouth every evening. ) 30 tablet 4  . promethazine (PHENERGAN) 12.5 MG tablet Take 12.5 mg by mouth every 6 (six) hours as needed for nausea or vomiting.    . simethicone (GAS-X) 80 MG chewable tablet Chew 2 tablets (160 mg total) by mouth 4 (four) times daily as needed for flatulence (also available over the counter). 60 tablet 1   No current facility-administered medications for this visit.  Family History  Problem Relation Age of Onset  . Diabetes Father   . Lung cancer Maternal Uncle   . Breast cancer Paternal Aunt   . Breast cancer Maternal Grandmother     Review of Systems  Constitutional: Negative.   HENT: Negative.   Eyes: Negative.   Respiratory: Negative.   Cardiovascular: Negative.   Gastrointestinal: Negative.   Endocrine: Negative.   Genitourinary: Positive for dyspareunia.  Musculoskeletal: Negative.   Skin: Negative.   Allergic/Immunologic: Negative.   Neurological: Negative.   Psychiatric/Behavioral: Negative.     Exam:   BP 110/70 mmHg  Pulse 80  Resp 16  Ht 5' 1.25" (1.556 m)  Wt 143 lb (64.864 kg)  BMI  26.79 kg/m2  Weight change: @ Height:   Height: 5' 1.25" (155.6 cm)  Ht Readings from Last 3 Encounters:  09/14/15 5' 1.25" (1.556 m)  12/02/14  (1.549 m)  10/19/14  (1.549 m)    General appearance: alert, cooperative and appears stated age Head: Normocephalic, without obvious abnormality, atraumatic Neck: no adenopathy, supple, symmetrical, trachea midline and thyroid normal to inspection and palpation Lungs: clear to auscultation bilaterally Breasts: normal appearance, no masses or tenderness Heart: regular rate and rhythm Abdomen: soft, non-tender; bowel sounds normal; no masses,  no organomegaly Extremities: extremities normal, atraumatic, no cyanosis or edema Skin: Skin color, texture, turgor normal. No rashes or lesions Lymph nodes: Cervical, supraclavicular, and axillary nodes normal. No abnormal inguinal nodes palpated Neurologic: Grossly normal   Pelvic: External genitalia:  no lesions. No vestibular pain with palpation with a cotton swab              Urethra:  normal appearing urethra with no masses, tenderness or lesions              Bartholins and Skenes: normal                 Vagina: normal appearing vagina with normal color and discharge, no lesions              Cervix: no lesions               Bimanual Exam:  Uterus:  normal size, contour, position, consistency, mobility, non-tender              Adnexa: no mass, fullness, tenderness               Rectovaginal: Confirms               Anus:  normal sphincter tone, no lesions Pelvic Floor:not tender  Chaperone was present for exam.  A:  Well Woman with normal exam  Contraception, skyla IUD due for removal in 9/17  Dyspareunia, entry. Occurred with first attempt in over a year secondary to medical issues. Normal exam  P:   No pap this year  Return in 9/17 for skyla removal, kyleena insertion  Discussed calcium/vit D  Discussed breast self exam  Use a lubricant with intercourse, patient to  control rate and depth of penetration  Labs and immunizations with primary MD

## 2015-09-14 NOTE — Patient Instructions (Addendum)
EXERCISE AND DIET:  We recommended that you start or continue a regular exercise program for good health. Regular exercise means any activity that makes your heart beat faster and makes you sweat.  We recommend exercising at least 30 minutes per day at least 3 days a week, preferably 4 or 5.  We also recommend a diet low in fat and sugar.  Inactivity, poor dietary choices and obesity can cause diabetes, heart attack, stroke, and kidney damage, among others.    ALCOHOL AND SMOKING:  Women should limit their alcohol intake to no more than 7 drinks/beers/glasses of wine (combined, not each!) per week. Moderation of alcohol intake to this level decreases your risk of breast cancer and liver damage. And of course, no recreational drugs are part of a healthy lifestyle.  And absolutely no smoking or even second hand smoke. Most people know smoking can cause heart and lung diseases, but did you know it also contributes to weakening of your bones? Aging of your skin?  Yellowing of your teeth and nails?  CALCIUM AND VITAMIN D:  Adequate intake of calcium and Vitamin D are recommended.  The recommendations for exact amounts of these supplements seem to change often, but generally speaking 600 mg of calcium (either carbonate or citrate) and 800 units of Vitamin D per day seems prudent. Certain women may benefit from higher intake of Vitamin D.  If you are among these women, your doctor will have told you during your visit.    PAP SMEARS:  Pap smears, to check for cervical cancer or precancers,  have traditionally been done yearly, although recent scientific advances have shown that most women can have pap smears less often.  However, every woman still should have a physical exam from her gynecologist every year. It will include a breast check, inspection of the vulva and vagina to check for abnormal growths or skin changes, a visual exam of the cervix, and then an exam to evaluate the size and shape of the uterus and  ovaries.  And after 37 years of age, a rectal exam is indicated to check for rectal cancers. We will also provide age appropriate advice regarding health maintenance, like when you should have certain vaccines, screening for sexually transmitted diseases, bone density testing, colonoscopy, mammograms, etc.      

## 2015-09-14 NOTE — Telephone Encounter (Signed)
Patient requesting appointment for IUD in September. Please enter orders for pre-certification purposes. Routing to Triage

## 2015-09-14 NOTE — Telephone Encounter (Signed)
Patient was seen today and need to schedule a IUD removal and insertion September 2017 with Dr.Jertson.

## 2015-09-19 ENCOUNTER — Ambulatory Visit: Payer: BLUE CROSS/BLUE SHIELD | Admitting: Obstetrics and Gynecology

## 2015-10-24 ENCOUNTER — Telehealth: Payer: Self-pay | Admitting: Obstetrics and Gynecology

## 2015-10-24 NOTE — Telephone Encounter (Signed)
Patient called to schedule a Skyla removal and insertion of "a new five year IUD for sometime in September of this year."

## 2015-10-31 NOTE — Telephone Encounter (Signed)
Patient called and left a message for Becky on the voicemail on 10/27/15.

## 2015-10-31 NOTE — Telephone Encounter (Signed)
Spoke with patient. Patient scheduled and agreeable to arrival date/time. Patient aware we will contact with benefits closer to appointment date. No further questions. Ok to close.

## 2016-02-24 HISTORY — PX: INTRAUTERINE DEVICE (IUD) INSERTION: SHX5877

## 2016-02-29 ENCOUNTER — Ambulatory Visit (INDEPENDENT_AMBULATORY_CARE_PROVIDER_SITE_OTHER): Payer: BLUE CROSS/BLUE SHIELD | Admitting: Obstetrics and Gynecology

## 2016-02-29 ENCOUNTER — Encounter: Payer: Self-pay | Admitting: Obstetrics and Gynecology

## 2016-02-29 VITALS — BP 110/70 | HR 78 | Resp 16 | Ht 61.25 in | Wt 157.4 lb

## 2016-02-29 DIAGNOSIS — Z30433 Encounter for removal and reinsertion of intrauterine contraceptive device: Secondary | ICD-10-CM

## 2016-02-29 DIAGNOSIS — Z01812 Encounter for preprocedural laboratory examination: Secondary | ICD-10-CM | POA: Diagnosis not present

## 2016-02-29 DIAGNOSIS — Z113 Encounter for screening for infections with a predominantly sexual mode of transmission: Secondary | ICD-10-CM

## 2016-02-29 LAB — POCT URINE PREGNANCY: Preg Test, Ur: NEGATIVE

## 2016-02-29 NOTE — Progress Notes (Signed)
GYNECOLOGY  VISIT   HPI: 35 y.o.   Married  Caucasian  female   G0P0000 with No LMP recorded. Patient is not currently having periods (Reason: IUD).   here for IUD removal and reinsertion of Kyleena IUD.   She was c/o some entry dyspareunia, helped with lubricant.   GYNECOLOGIC HISTORY: No LMP recorded. Patient is not currently having periods (Reason: IUD). Contraception:IUD Menopausal hormone therapy: none        OB History    Gravida Para Term Preterm AB Living   0 0 0 0 0 0   SAB TAB Ectopic Multiple Live Births   0 0 0 0           Patient Active Problem List   Diagnosis Date Noted  . Dehydration with hyponatremia 01/05/2015  . SBO (small bowel obstruction) (HCC) 01/05/2015  . Small bowel obstruction (HCC) 10/19/2014  . Abdominal pain 10/19/2014  . IUD (intrauterine device) in place 02/23/2013  . Ocular migraine   . Migraine with aura   . Anxiety 09/29/2007  . Depression 09/29/2007  . GERD 09/29/2007  . IRRITABLE BOWEL SYNDROME 09/29/2007  . RUQ PAIN 09/29/2007    Past Medical History:  Diagnosis Date  . Anxiety   . Depression   . Dysmenorrhea   . IUD (intrauterine device) in place 02/2013   skyla  . Migraine with aura    "maybe monthly, if that" (01/05/2015)  . Ocular migraine    "very rare" (01/05/2015)  . SBO (small bowel obstruction) (HCC) 09/2014; 01/05/2015    Past Surgical History:  Procedure Laterality Date  . REFRACTIVE SURGERY Bilateral 2000  . SMALL INTESTINE SURGERY  2016   . WISDOM TOOTH EXTRACTION  ~ 2000   "all 4"    Current Outpatient Prescriptions  Medication Sig Dispense Refill  . Aspirin-Acetaminophen-Caffeine (EXCEDRIN PO) Take 1 tablet by mouth daily as needed (for headache).     . clonazePAM (KLONOPIN) 0.5 MG tablet Take 0.5 mg by mouth daily as needed for anxiety.     . DOCOSAHEXAENOIC ACID PO Take 1 g by mouth.    . escitalopram (LEXAPRO) 10 MG tablet Take 15 mg by mouth daily.     . Levonorgestrel (KYLEENA) 19.5 MG IUD by  Intrauterine route once.    . Multiple Vitamins-Minerals (MULTIVITAMIN PO) Take 1 tablet by mouth daily.     . promethazine (PHENERGAN) 12.5 MG tablet Take 12.5 mg by mouth every 6 (six) hours as needed for nausea or vomiting.    . simethicone (GAS-X) 80 MG chewable tablet Chew 2 tablets (160 mg total) by mouth 4 (four) times daily as needed for flatulence (also available over the counter). 60 tablet 1   No current facility-administered medications for this visit.      ALLERGIES: Prednisone; Tramadol; and Sudafed [pseudoephedrine hcl]  Family History  Problem Relation Age of Onset  . Diabetes Father   . Lung cancer Maternal Uncle   . Breast cancer Paternal Aunt   . Breast cancer Maternal Grandmother     Social History   Social History  . Marital status: Married    Spouse name: N/A  . Number of children: N/A  . Years of education: N/A   Occupational History  . Not on file.   Social History Main Topics  . Smoking status: Never Smoker  . Smokeless tobacco: Never Used  . Alcohol use 1.0 oz/week    2 Standard drinks or equivalent per week     Comment: 01/05/2015 "no alcohol  since January; before that maybe 2 drinks q other week, beer/wine/or liquor"  . Drug use: No  . Sexual activity: Yes    Partners: Male    Birth control/ protection: IUD     Comment: Skyla   Other Topics Concern  . Not on file   Social History Narrative  . No narrative on file    Review of Systems  Constitutional: Negative.   HENT: Negative.   Eyes: Negative.   Respiratory: Negative.   Cardiovascular: Negative.   Gastrointestinal: Negative.   Genitourinary: Negative.   Musculoskeletal: Negative.   Skin: Negative.   Neurological: Negative.   Endo/Heme/Allergies: Negative.   Psychiatric/Behavioral: Negative.     PHYSICAL EXAMINATION:    BP 110/70 (BP Location: Right Arm, Patient Position: Sitting, Cuff Size: Normal)   Pulse 78   Resp 16   Ht 5' 1.25" (1.556 m)   Wt 157 lb 6.4 oz (71.4  kg)   BMI 29.50 kg/m     General appearance: alert, cooperative and appears stated age  Pelvic: External genitalia:  no lesions              Urethra:  normal appearing urethra with no masses, tenderness or lesions              Bartholins and Skenes: normal                 Vagina: normal appearing vagina with normal color and discharge, no lesions              Cervix: no lesions  The risks of the kyleena IUD were reviewed with the patient, including infection, abnormal bleeding and uterine perfortion. Consent was signed.  A speculum was placed in the vagina, the cervix was cleansed with betadine. The sklya IUD was removed. A tenaculum was placed on the cervix, the uterus sounded to 7 cm. The cervix was dilated to a 4 hagar dilator  The kyleena IUD was inserted without difficulty. The string were cut to 3-4 cm. The tenaculum was removed.   The patient tolerated the procedure very well.   Chaperone was present for exam.  ASSESSMENT IUD removal and reinsertion of a Kyleena   PLAN Genprobe F/U in 1 month   An After Visit Summary was printed and given to the patient.

## 2016-02-29 NOTE — Patient Instructions (Signed)

## 2016-03-01 LAB — GC/CHLAMYDIA PROBE AMP
CT Probe RNA: NOT DETECTED
GC PROBE AMP APTIMA: NOT DETECTED

## 2016-03-07 ENCOUNTER — Ambulatory Visit: Payer: BLUE CROSS/BLUE SHIELD | Admitting: Obstetrics and Gynecology

## 2016-03-27 ENCOUNTER — Ambulatory Visit: Payer: BLUE CROSS/BLUE SHIELD | Admitting: Obstetrics and Gynecology

## 2016-03-27 ENCOUNTER — Encounter: Payer: Self-pay | Admitting: Obstetrics and Gynecology

## 2016-03-27 VITALS — BP 118/70 | HR 64 | Resp 14 | Wt 157.0 lb

## 2016-03-27 DIAGNOSIS — F39 Unspecified mood [affective] disorder: Secondary | ICD-10-CM | POA: Diagnosis not present

## 2016-03-27 DIAGNOSIS — R4586 Emotional lability: Secondary | ICD-10-CM

## 2016-03-27 DIAGNOSIS — Z30431 Encounter for routine checking of intrauterine contraceptive device: Secondary | ICD-10-CM | POA: Diagnosis not present

## 2016-03-27 DIAGNOSIS — Z803 Family history of malignant neoplasm of breast: Secondary | ICD-10-CM

## 2016-03-27 NOTE — Progress Notes (Signed)
GYNECOLOGY  VISIT   HPI: 35 y.o.   Married  Caucasian  female   G0P0000 with Patient's last menstrual period was 03/01/2016.   [facility] She had a skyla removed at that visit. She has had some mood changes, but has a h/o depression and anxiety. Not sure if it's from the IUD or just her. She is on Lexapro 15 mg a day. Last episode of worsening mood was 2 weeks ago. She is following up with her primary. Spotting off and on since insertion. Some cramping the first week the kyleena was in and a few random cramps, not bad enough to take medication.      GYNECOLOGIC HISTORY: Patient's last menstrual period was 03/01/2016. Contraception:IUD (Kyleena) Menopausal hormone therapy: none         OB History    Gravida Para Term Preterm AB Living   0 0 0 0 0 0   SAB TAB Ectopic Multiple Live Births   0 0 0 0           Patient Active Problem List   Diagnosis Date Noted  . Dehydration with hyponatremia 01/05/2015  . SBO (small bowel obstruction) 01/05/2015  . Small bowel obstruction 10/19/2014  . Abdominal pain 10/19/2014  . IUD (intrauterine device) in place 02/23/2013  . Ocular migraine   . Migraine with aura   . Anxiety 09/29/2007  . Depression 09/29/2007  . GERD 09/29/2007  . IRRITABLE BOWEL SYNDROME 09/29/2007  . RUQ PAIN 09/29/2007    Past Medical History:  Diagnosis Date  . Anxiety   . Depression   . Dysmenorrhea   . IUD (intrauterine device) in place 02/2013   skyla  . Migraine with aura    "maybe monthly, if that" (01/05/2015)  . Ocular migraine    "very rare" (01/05/2015)  . SBO (small bowel obstruction) 09/2014; 01/05/2015    Past Surgical History:  Procedure Laterality Date  . REFRACTIVE SURGERY Bilateral 2000  . SMALL INTESTINE SURGERY  2016   . WISDOM TOOTH EXTRACTION  ~ 2000   "all 4"    Current Outpatient Prescriptions  Medication Sig Dispense Refill  . Aspirin-Acetaminophen-Caffeine (EXCEDRIN PO) Take 1 tablet by mouth daily as  needed (for headache).     . clonazePAM (KLONOPIN) 0.5 MG tablet Take 0.5 mg by mouth daily as needed for anxiety.     . DOCOSAHEXAENOIC ACID PO Take 1 g by mouth.    . escitalopram (LEXAPRO) 10 MG tablet Take 15 mg by mouth daily.     . Levonorgestrel (KYLEENA) 19.5 MG IUD by Intrauterine route once.    . Multiple Vitamins-Minerals (MULTIVITAMIN PO) Take 1 tablet by mouth daily.     . promethazine (PHENERGAN) 12.5 MG tablet Take 12.5 mg by mouth every 6 (six) hours as needed for nausea or vomiting.    . simethicone (GAS-X) 80 MG chewable tablet Chew 2 tablets (160 mg total) by mouth 4 (four) times daily as needed for flatulence (also available over the counter). 60 tablet 1  . TURMERIC PO Take by mouth.     No current facility-administered medications for this visit.      ALLERGIES: Prednisone; Tramadol; and Sudafed [pseudoephedrine hcl]  Family History  Problem Relation Age of Onset  . Diabetes Father   . Lung cancer Maternal Uncle   . Breast cancer Paternal Aunt   . Breast cancer Maternal Grandmother     Social History   Social History  . Marital status: Married  Spouse name: N/A  . Number of children: N/A  . Years of education: N/A   Occupational History  . Not on file.   Social History Main Topics  . Smoking status: Never Smoker  . Smokeless tobacco: Never Used  . Alcohol use 1.0 oz/week    2 Standard drinks or equivalent per week     Comment: 01/05/2015 "no alcohol since January; before that maybe 2 drinks q other week, beer/wine/or liquor"  . Drug use: No  . Sexual activity: Yes    Partners: Male    Birth control/ protection: IUD     Comment: Skyla   Other Topics Concern  . Not on file   Social History Narrative  . No narrative on file    Review of Systems  Constitutional: Negative.   HENT: Negative.   Eyes: Negative.   Respiratory: Negative.   Cardiovascular: Negative.   Gastrointestinal: Negative.   Genitourinary: Negative.   Musculoskeletal:  Negative.   Skin: Negative.   Neurological: Negative.   Endo/Heme/Allergies: Negative.   Psychiatric/Behavioral: Negative.     PHYSICAL EXAMINATION:    BP 118/70 (BP Location: Right Arm, Patient Position: Sitting, Cuff Size: Normal)   Pulse 64   Resp 14   Wt 157 lb (71.2 kg)   LMP 03/01/2016 Comment: Kyleena inserted 02/29/16  BMI 29.42 kg/m     General appearance: alert, cooperative and appears stated age  Pelvic: External genitalia:  no lesions              Urethra:  normal appearing urethra with no masses, tenderness or lesions              Bartholins and Skenes: normal                 Vagina: normal appearing vagina with normal color and discharge, no lesions              Cervix: no lesions and IUD string 3 cm              Bimanual Exam:  Uterus:  normal size, contour, position, consistency, mobility, non-tender              Adnexa: no mass, fullness, tenderness               Chaperone was present for exam.  ASSESSMENT IUD check, doing well Mood changes, will f/u with her primary FH of breast cancer in her MGM at ?4    PLAN Due for an annual in 3/18 Discussed increasing her lexapro, she will wait until she sees her primary Recommend mammogram at 38 Recommend breast self exams monthly    An After Visit Summary was printed and given to the patient.  15 minutes face to face time of which over 50% was spent in counseling.

## 2016-06-29 IMAGING — CT CT ABD-PELV W/ CM
2 of 4 series · 10 of 36 positions shown, 17 images · IV contrast (READICAT/WATER & [ID] ISOVUE 300)
Comparison: None.

CLINICAL DATA: Upper abdominal pain. cramping and bloating for 3
months. 30 pound weight gain. Loss of appetite. Gastroesophageal
reflux disease. Irritable bowel syndrome.

EXAM:
CT ABDOMEN AND PELVIS WITH CONTRAST
TECHNIQUE: Multidetector CT imaging of the abdomen and pelvis was performed
using the standard protocol following bolus administration of
intravenous contrast.
CONTRAST:  100 cc of [id number]

[Series 3: abd/pelvis with · axial · 0.70mm/px · z∈[-291,+49]mm · 9 of 86 slices shown, 15 images]
[im 9/86  soft-tissue]
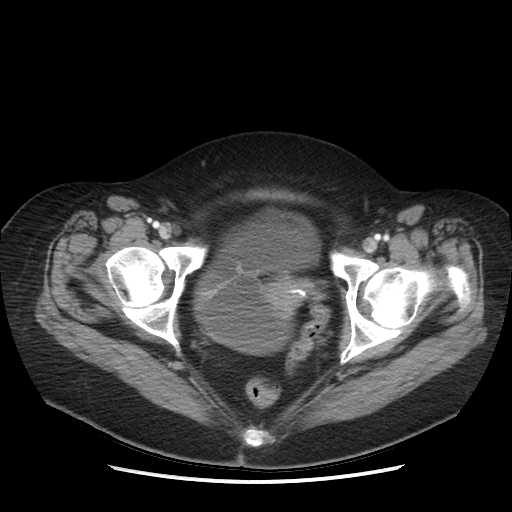
[im 9/86  bone]
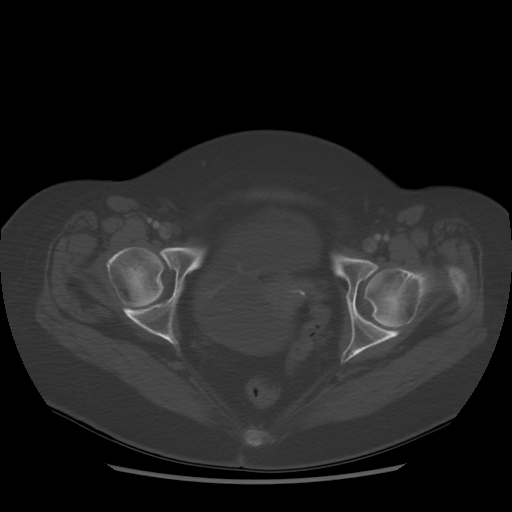
[im 18/86  soft-tissue]
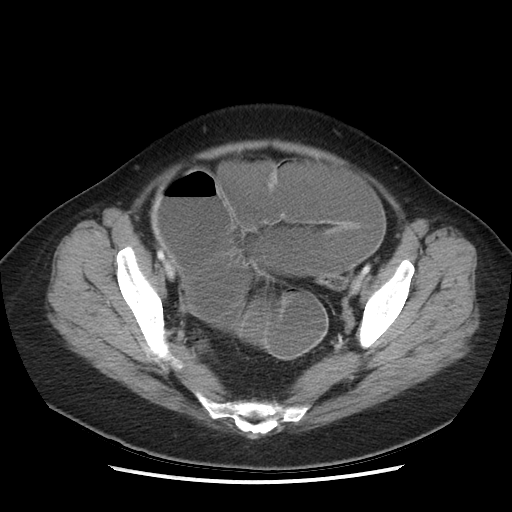
[im 26/86  soft-tissue]
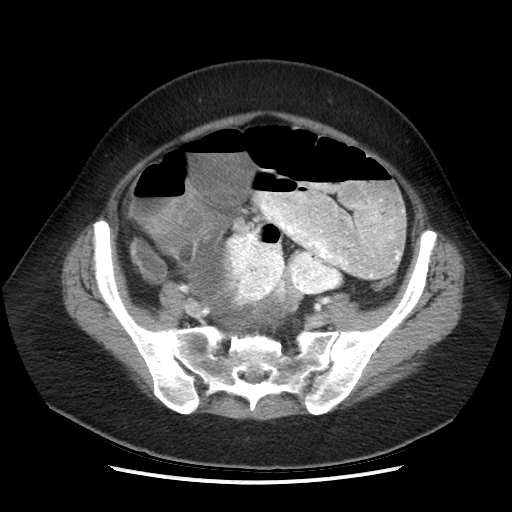
[im 35/86  soft-tissue]
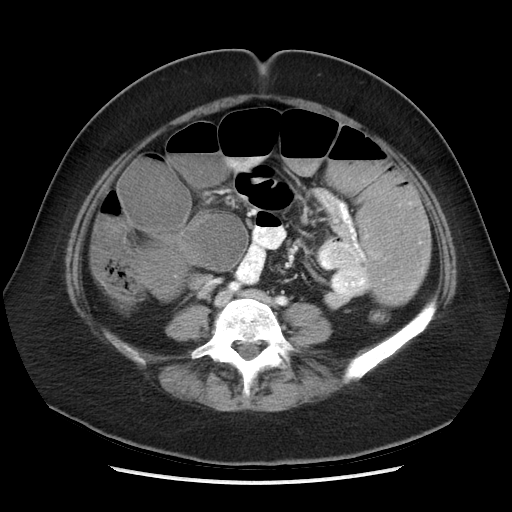
[im 43/86  soft-tissue]
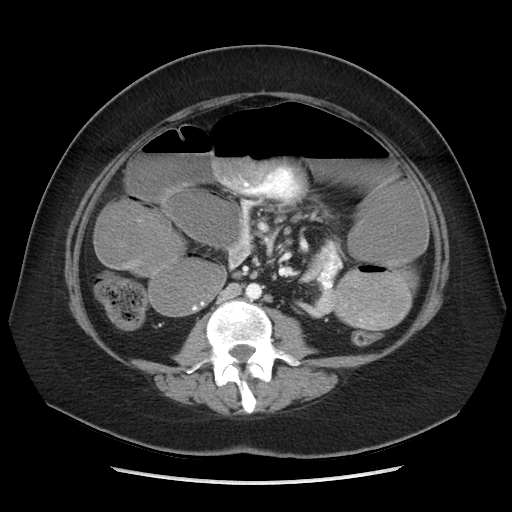
[im 52/86  soft-tissue]
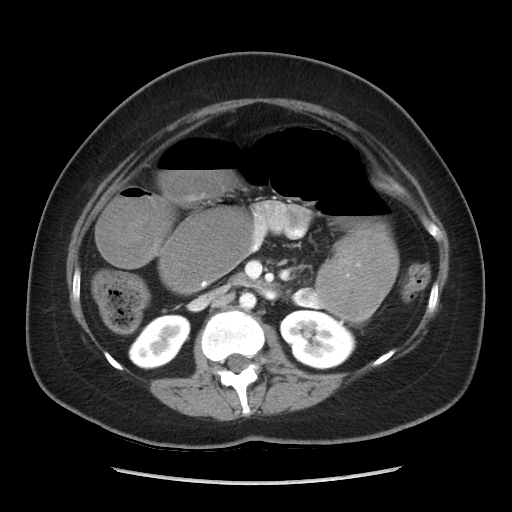
[im 52/86  lung]
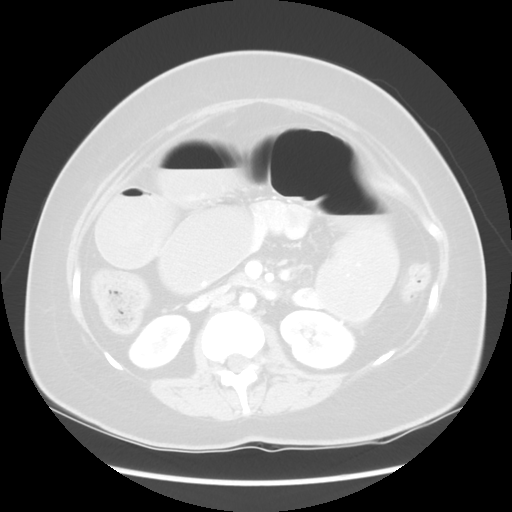
[im 60/86  soft-tissue]
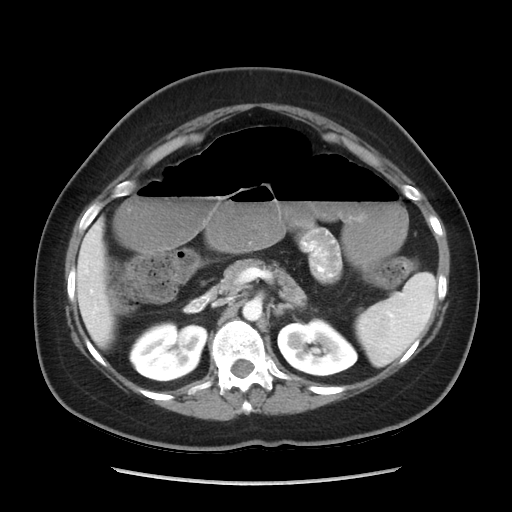
[im 60/86  lung]
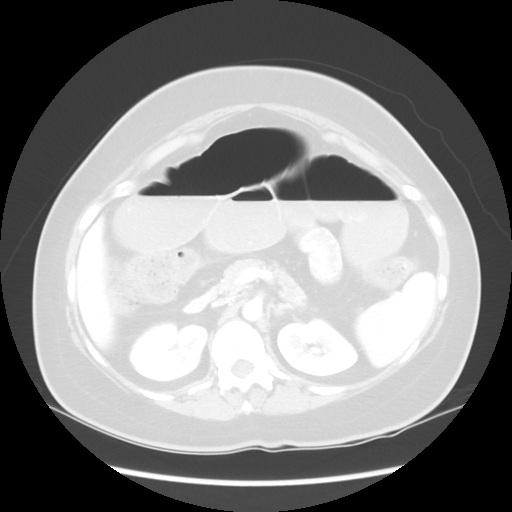
[im 69/86  soft-tissue]
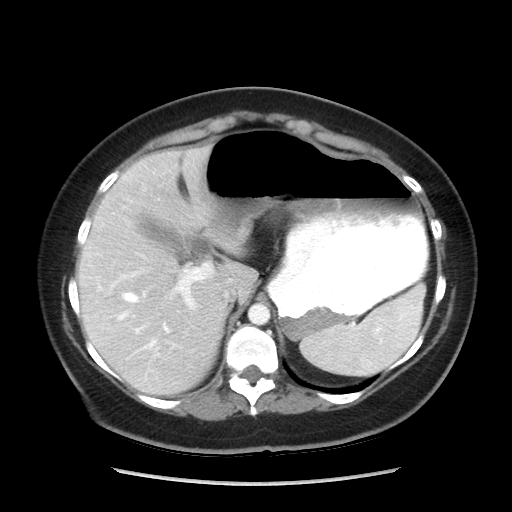
[im 69/86  lung]
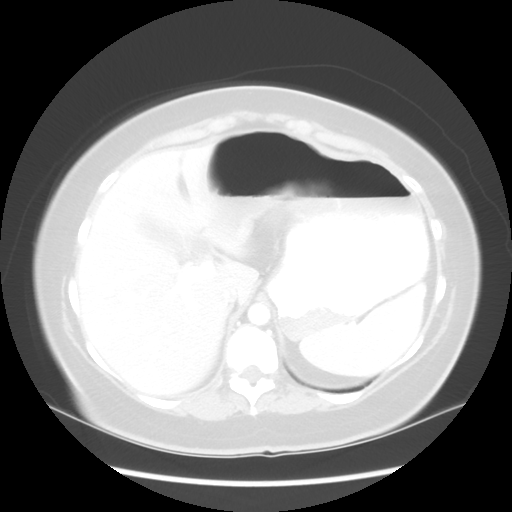
[im 77/86  soft-tissue]
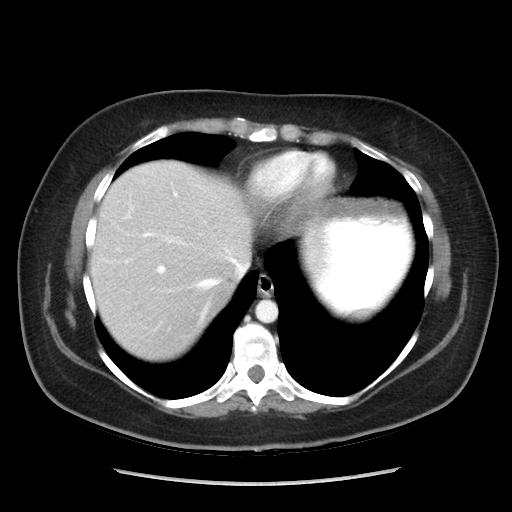
[im 77/86  lung]
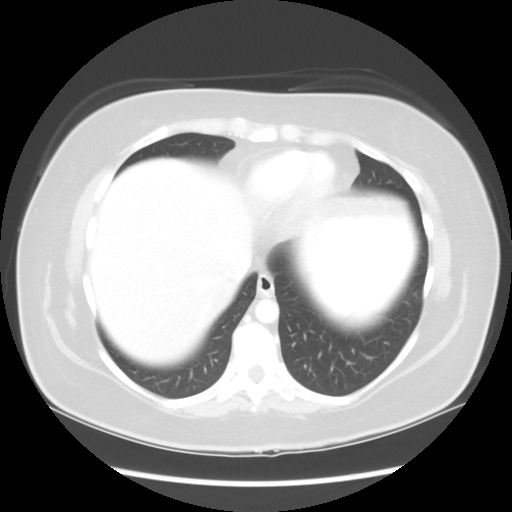
[im 77/86  bone]
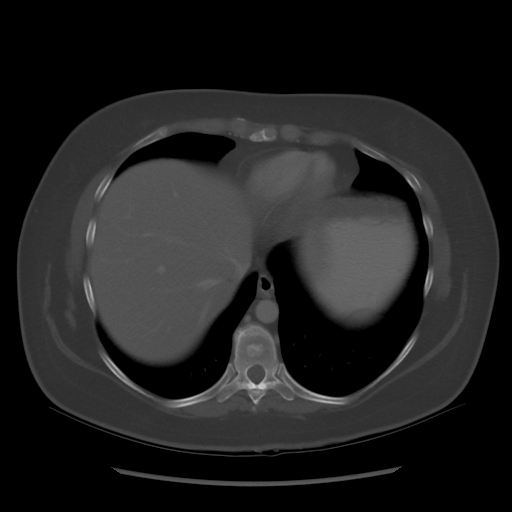

[Series 601: coronal body · coronal · 0.91mm/px · 1 of 122 slices shown, 2 images]
[im 41/122  soft-tissue]
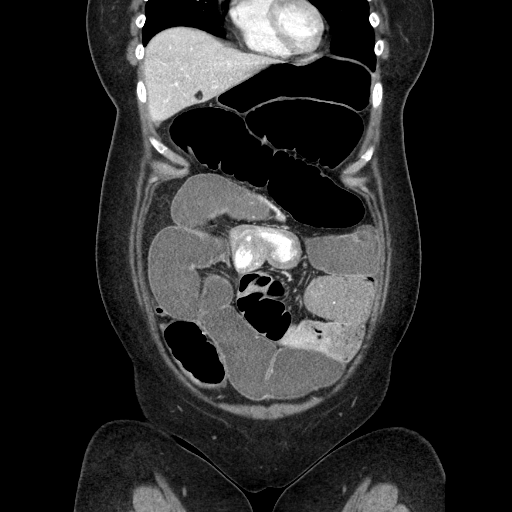
[im 41/122  bone]
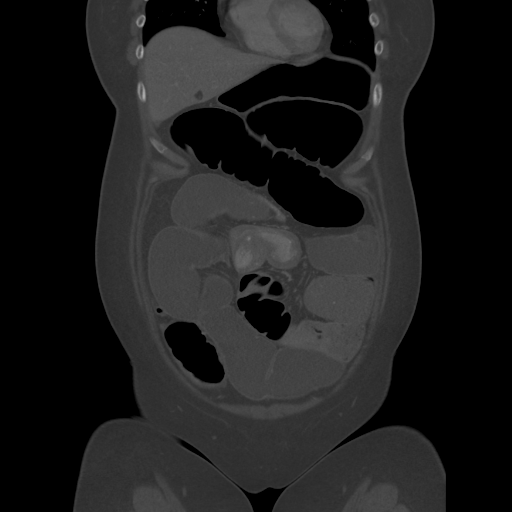

[10 of 36 positions shown; findings below may reference images not displayed]

FINDINGS: Lower chest: Clear lung bases. Normal heart size without pericardial
or pleural effusion.

Hepatobiliary: Normal liver. Normal gallbladder, without biliary
ductal dilatation.

Pancreas: Normal, without mass or ductal dilatation.

Spleen: Normal

Adrenals/Urinary Tract: Normal adrenal glands. Normal kidneys,
without hydronephrosis. Normal urinary bladder.

Stomach/Bowel: Normal stomach, without wall thickening. The
left-sided colon is normal to decompressed in caliber. Normal
appendix and terminal ileum. Proximal small bowel loops are normal
in caliber. Dilatation of mid to distal small bowel with transition
identified at the distal ileum. Example coronal images 46 through
59. No obstructive mass or bowel wall inflammation identified. Small
bowel dilatation at up to 5.5 cm.

No small bowel wall thickening to suggest complicating ischemia.

Vascular/Lymphatic: Normal caliber of the aorta and branch vessels.
No abdominopelvic adenopathy.

Reproductive: Intrauterine device.  No adnexal mass.

Other: No ascites.

Musculoskeletal: Convex right lumbar spine curvature.
IMPRESSION: 1. Moderate mid and distal small bowel distension with transition
identified at the distal ileum. No obstructive cause identified.
Depending on patient's symptoms, chronic inflammation as can be seen
with Crohn disease could have this appearance.
2. Normal caliber of the proximal most small bowel. This is favored
to be related to partial obstruction and lack of propagation
proximally. No specific findings to suggest closed loop type
obstruction, which should be excluded clinically.
These results will be called to the ordering clinician or
representative by the [HOSPITAL] at the imaging location.

## 2016-09-19 ENCOUNTER — Ambulatory Visit: Payer: BLUE CROSS/BLUE SHIELD | Admitting: Obstetrics and Gynecology

## 2016-09-20 ENCOUNTER — Ambulatory Visit (INDEPENDENT_AMBULATORY_CARE_PROVIDER_SITE_OTHER): Payer: BLUE CROSS/BLUE SHIELD | Admitting: Obstetrics and Gynecology

## 2016-09-20 ENCOUNTER — Encounter: Payer: Self-pay | Admitting: Obstetrics and Gynecology

## 2016-09-20 VITALS — BP 120/80 | HR 72 | Resp 16 | Ht 61.5 in | Wt 162.0 lb

## 2016-09-20 DIAGNOSIS — Z124 Encounter for screening for malignant neoplasm of cervix: Secondary | ICD-10-CM | POA: Diagnosis not present

## 2016-09-20 DIAGNOSIS — Z0000 Encounter for general adult medical examination without abnormal findings: Secondary | ICD-10-CM | POA: Diagnosis not present

## 2016-09-20 DIAGNOSIS — E559 Vitamin D deficiency, unspecified: Secondary | ICD-10-CM

## 2016-09-20 DIAGNOSIS — Z30431 Encounter for routine checking of intrauterine contraceptive device: Secondary | ICD-10-CM | POA: Diagnosis not present

## 2016-09-20 DIAGNOSIS — Z01419 Encounter for gynecological examination (general) (routine) without abnormal findings: Secondary | ICD-10-CM | POA: Diagnosis not present

## 2016-09-20 DIAGNOSIS — Z113 Encounter for screening for infections with a predominantly sexual mode of transmission: Secondary | ICD-10-CM | POA: Diagnosis not present

## 2016-09-20 LAB — COMPREHENSIVE METABOLIC PANEL
ALT: 12 U/L (ref 6–29)
AST: 14 U/L (ref 10–30)
Albumin: 4.5 g/dL (ref 3.6–5.1)
Alkaline Phosphatase: 78 U/L (ref 33–115)
BILIRUBIN TOTAL: 0.5 mg/dL (ref 0.2–1.2)
BUN: 10 mg/dL (ref 7–25)
CO2: 25 mmol/L (ref 20–31)
CREATININE: 0.71 mg/dL (ref 0.50–1.10)
Calcium: 9.6 mg/dL (ref 8.6–10.2)
Chloride: 101 mmol/L (ref 98–110)
Glucose, Bld: 81 mg/dL (ref 65–99)
Potassium: 4.4 mmol/L (ref 3.5–5.3)
SODIUM: 139 mmol/L (ref 135–146)
Total Protein: 7.8 g/dL (ref 6.1–8.1)

## 2016-09-20 LAB — CBC
HCT: 41 % (ref 35.0–45.0)
Hemoglobin: 13.5 g/dL (ref 11.7–15.5)
MCH: 27.8 pg (ref 27.0–33.0)
MCHC: 32.9 g/dL (ref 32.0–36.0)
MCV: 84.5 fL (ref 80.0–100.0)
MPV: 9.9 fL (ref 7.5–12.5)
Platelets: 260 10*3/uL (ref 140–400)
RBC: 4.85 MIL/uL (ref 3.80–5.10)
RDW: 14.1 % (ref 11.0–15.0)
WBC: 6.4 10*3/uL (ref 3.8–10.8)

## 2016-09-20 LAB — LIPID PANEL
Cholesterol: 262 mg/dL — ABNORMAL HIGH (ref <200)
HDL: 74 mg/dL (ref >50)
LDL CALC: 153 mg/dL — AB (ref <100)
Total CHOL/HDL Ratio: 3.5 Ratio (ref <5.0)
Triglycerides: 173 mg/dL — ABNORMAL HIGH (ref <150)
VLDL: 35 mg/dL — ABNORMAL HIGH (ref <30)

## 2016-09-20 NOTE — Patient Instructions (Signed)
EXERCISE AND DIET:  We recommended that you start or continue a regular exercise program for good health. Regular exercise means any activity that makes your heart beat faster and makes you sweat.  We recommend exercising at least 30 minutes per day at least 3 days a week, preferably 4 or 5.  We also recommend a diet low in fat and sugar.  Inactivity, poor dietary choices and obesity can cause diabetes, heart attack, stroke, and kidney damage, among others.    ALCOHOL AND SMOKING:  Women should limit their alcohol intake to no more than 7 drinks/beers/glasses of wine (combined, not each!) per week. Moderation of alcohol intake to this level decreases your risk of breast cancer and liver damage. And of course, no recreational drugs are part of a healthy lifestyle.  And absolutely no smoking or even second hand smoke. Most people know smoking can cause heart and lung diseases, but did you know it also contributes to weakening of your bones? Aging of your skin?  Yellowing of your teeth and nails?  CALCIUM AND VITAMIN D:  Adequate intake of calcium and Vitamin D are recommended.  The recommendations for exact amounts of these supplements seem to change often, but generally speaking 600 mg of calcium (either carbonate or citrate) and 800 units of Vitamin D per day seems prudent. Certain women may benefit from higher intake of Vitamin D.  If you are among these women, your doctor will have told you during your visit.    PAP SMEARS:  Pap smears, to check for cervical cancer or precancers,  have traditionally been done yearly, although recent scientific advances have shown that most women can have pap smears less often.  However, every woman still should have a physical exam from her gynecologist every year. It will include a breast check, inspection of the vulva and vagina to check for abnormal growths or skin changes, a visual exam of the cervix, and then an exam to evaluate the size and shape of the uterus and  ovaries.  And after 38 years of age, a rectal exam is indicated to check for rectal cancers. We will also provide age appropriate advice regarding health maintenance, like when you should have certain vaccines, screening for sexually transmitted diseases, bone density testing, colonoscopy, mammograms, etc.   MAMMOGRAMS:  All women over 40 years old should have a yearly mammogram. Many facilities now offer a "3D" mammogram, which may cost around $50 extra out of pocket. If possible,  we recommend you accept the option to have the 3D mammogram performed.  It both reduces the number of women who will be called back for extra views which then turn out to be normal, and it is better than the routine mammogram at detecting truly abnormal areas.    COLONOSCOPY:  Colonoscopy to screen for colon cancer is recommended for all women at age 50.  We know, you hate the idea of the prep.  We agree, BUT, having colon cancer and not knowing it is worse!!  Colon cancer so often starts as a polyp that can be seen and removed at colonscopy, which can quite literally save your life!  And if your first colonoscopy is normal and you have no family history of colon cancer, most women don't have to have it again for 10 years.  Once every ten years, you can do something that may end up saving your life, right?  We will be happy to help you get it scheduled when you are ready.    Be sure to check your insurance coverage so you understand how much it will cost.  It may be covered as a preventative service at no cost, but you should check your particular policy.      Breast Self-Awareness Breast self-awareness means being familiar with how your breasts look and feel. It involves checking your breasts regularly and reporting any changes to your health care provider. Practicing breast self-awareness is important. A change in your breasts can be a sign of a serious medical problem. Being familiar with how your breasts look and feel allows  you to find any problems early, when treatment is more likely to be successful. All women should practice breast self-awareness, including women who have had breast implants. How to do a breast self-exam One way to learn what is normal for your breasts and whether your breasts are changing is to do a breast self-exam. To do a breast self-exam: Look for Changes   1. Remove all the clothing above your waist. 2. Stand in front of a mirror in a room with good lighting. 3. Put your hands on your hips. 4. Push your hands firmly downward. 5. Compare your breasts in the mirror. Look for differences between them (asymmetry), such as:  Differences in shape.  Differences in size.  Puckers, dips, and bumps in one breast and not the other. 6. Look at each breast for changes in your skin, such as:  Redness.  Scaly areas. 7. Look for changes in your nipples, such as:  Discharge.  Bleeding.  Dimpling.  Redness.  A change in position. Feel for Changes   Carefully feel your breasts for lumps and changes. It is best to do this while lying on your back on the floor and again while sitting or standing in the shower or tub with soapy water on your skin. Feel each breast in the following way:  Place the arm on the side of the breast you are examining above your head.  Feel your breast with the other hand.  Start in the nipple area and make  inch (2 cm) overlapping circles to feel your breast. Use the pads of your three middle fingers to do this. Apply light pressure, then medium pressure, then firm pressure. The light pressure will allow you to feel the tissue closest to the skin. The medium pressure will allow you to feel the tissue that is a little deeper. The firm pressure will allow you to feel the tissue close to the ribs.  Continue the overlapping circles, moving downward over the breast until you feel your ribs below your breast.  Move one finger-width toward the center of the body.  Continue to use the  inch (2 cm) overlapping circles to feel your breast as you move slowly up toward your collarbone.  Continue the up and down exam using all three pressures until you reach your armpit. Write Down What You Find   Write down what is normal for each breast and any changes that you find. Keep a written record with breast changes or normal findings for each breast. By writing this information down, you do not need to depend only on memory for size, tenderness, or location. Write down where you are in your menstrual cycle, if you are still menstruating. If you are having trouble noticing differences in your breasts, do not get discouraged. With time you will become more familiar with the variations in your breasts and more comfortable with the exam. How often should I examine my   breasts? Examine your breasts every month. If you are breastfeeding, the best time to examine your breasts is after a feeding or after using a breast pump. If you menstruate, the best time to examine your breasts is 5-7 days after your period is over. During your period, your breasts are lumpier, and it may be more difficult to notice changes. When should I see my health care provider? See your health care provider if you notice:  A change in shape or size of your breasts or nipples.  A change in the skin of your breast or nipples, such as a reddened or scaly area.  Unusual discharge from your nipples.  A lump or thick area that was not there before.  Pain in your breasts.  Anything that concerns you. This information is not intended to replace advice given to you by your health care provider. Make sure you discuss any questions you have with your health care provider. Document Released: 06/11/2005 Document Revised: 11/17/2015 Document Reviewed: 05/01/2015 [employer] Interactive Patient Education  2017 [employer]  

## 2016-09-20 NOTE — Progress Notes (Signed)
36 y.o. G0P0000 MarriedCaucasianF here for annual exam.  She has a kyleena IUD, placed on 02/29/16. No menses with the [facility]. Sexually active just on occasion, no pain. Husband with some ED issues, he is almost 38, healthy, no meds, under stress. Encouraged her to have him talk with his physician about this.  She has had a SBO, had to have a bowel resection. Being followed by GI, possible Chron's. Last colonoscopy was a year ago, it was normal. She has intermittent diarrhea, needs to avoid certain foods.     No LMP recorded. Patient is not currently having periods (Reason: IUD).          Sexually active: Yes.    The current method of family planning is IUD.    Exercising: Yes.   walking, hiking, stretches,/ squats   Smoker:  Yes- smokes socially   Health Maintenance: Pap:  01-15-13 WNL NEG HR HPV  History of abnormal Pap:  no MMG:  Never Colonoscopy:  07/3015 WNL per patient  BMD:   Never TDaP:  2011 Gardasil: N/A   reports that she has been smoking Cigarettes.  She has never used smokeless tobacco. She reports that she drinks about 1.0 oz of alcohol per week . 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   Past Medical History:  Diagnosis Date  . Anxiety   . Depression   . Dysmenorrhea   . IUD (intrauterine device) in place 02/2013   skyla  . Migraine with aura    "maybe monthly, if that" (01/05/2015)  . Ocular migraine    "very rare" (01/05/2015)  . SBO (small bowel obstruction) 09/2014; 01/05/2015    Past Surgical History:  Procedure Laterality Date  . INTRAUTERINE DEVICE (IUD) INSERTION  02/2016   Kyleena  . REFRACTIVE SURGERY Bilateral 2000  . SMALL INTESTINE SURGERY  2016   . WISDOM TOOTH EXTRACTION  ~ 2000   "all 4"    Current Outpatient Prescriptions  Medication Sig Dispense Refill  . Aspirin-Acetaminophen-Caffeine (EXCEDRIN PO) Take 1 tablet by mouth daily as needed (for headache).     . clonazePAM (KLONOPIN)  0.5 MG tablet Take 0.5 mg by mouth daily as needed for anxiety.     . DOCOSAHEXAENOIC ACID PO Take 1 g by mouth.    . escitalopram (LEXAPRO) 10 MG tablet Take 15 mg by mouth daily.     . Levonorgestrel (KYLEENA) 19.5 MG IUD by Intrauterine route once.    . Melatonin 3 MG CAPS Take by mouth.    . Multiple Vitamins-Minerals (MULTIVITAMIN PO) Take 1 tablet by mouth daily.     . Omega-3 Fatty Acids (OMEGA-3 FISH OIL PO) Take by mouth.    . promethazine (PHENERGAN) 12.5 MG tablet Take 12.5 mg by mouth every 6 (six) hours as needed for nausea or vomiting.    . simethicone (GAS-X) 80 MG chewable tablet Chew 2 tablets (160 mg total) by mouth 4 (four) times daily as needed for flatulence (also available over the counter). 60 tablet 1  . TURMERIC PO Take by mouth.     No current facility-administered medications for this visit.     Family History  Problem Relation Age of Onset  . Diabetes Father   . Lung cancer Maternal Uncle   . Breast cancer Paternal Aunt   . Breast cancer Maternal Grandmother     Review of Systems  Constitutional: Negative.   HENT: Negative.   Eyes: Negative.  Respiratory: Negative.   Cardiovascular: Negative.   Gastrointestinal: Negative.   Endocrine: Negative.   Genitourinary: Negative.   Musculoskeletal: Negative.   Skin: Negative.   Allergic/Immunologic: Negative.   Neurological: Negative.   Psychiatric/Behavioral: Negative.     Exam:   BP 120/80 (BP Location: Right Arm, Patient Position: Sitting, Cuff Size: Normal)   Pulse 72   Resp 16   Ht 5' 1.5" (1.562 m)   Wt 162 lb (73.5 kg)   BMI 30.11 kg/m   Weight change: @WEIGHTCHANGE @ Height:   Height: 5' 1.5" (156.2 cm)  Ht Readings from Last 3 Encounters:  09/20/16 5' 1.5" (1.562 m)  02/29/16 5' 1.25" (1.556 m)  09/14/15 5' 1.25" (1.556 m)    General appearance: alert, cooperative and appears stated age Head: Normocephalic, without obvious abnormality, atraumatic Neck: no adenopathy, supple,  symmetrical, trachea midline and thyroid normal to inspection and palpation Lungs: clear to auscultation bilaterally Cardiovascular: regular rate and rhythm Breasts: normal appearance, no masses or tenderness Abdomen: soft, non-tender; bowel sounds normal; no masses,  no organomegaly Extremities: extremities normal, atraumatic, no cyanosis or edema Skin: Skin color, texture, turgor normal. No rashes or lesions Lymph nodes: Cervical, supraclavicular, and axillary nodes normal. No abnormal inguinal nodes palpated Neurologic: Grossly normal   Pelvic: External genitalia:  no lesions              Urethra:  normal appearing urethra with no masses, tenderness or lesions              Bartholins and Skenes: normal                 Vagina: normal appearing vagina with normal color and discharge, no lesions              Cervix: no lesions and IUD string 3 cm               Bimanual Exam:  Uterus:  normal size, contour, position, consistency, mobility, non-tender              Adnexa: no mass, fullness, tenderness               Rectovaginal: Confirms               Anus:  normal sphincter tone, no lesions  Chaperone was present for exam.  A:  Well Woman with normal exam  IUD check  Vit D def  P:   Pap with hpv  Screening labs  Discussed breast self exam  Discussed calcium and vit D intake  Desires testing for STD's, just the blood work, she had negative genprobe in 9/17  Given names of counselors (consider couples counseling)

## 2016-09-21 LAB — STD PANEL
HEP B S AG: NEGATIVE
HIV: NONREACTIVE

## 2016-09-21 LAB — HEPATITIS C ANTIBODY: HCV Ab: NEGATIVE

## 2016-09-21 LAB — VITAMIN D 25 HYDROXY (VIT D DEFICIENCY, FRACTURES): Vit D, 25-Hydroxy: 28 ng/mL — ABNORMAL LOW (ref 30–100)

## 2016-09-24 ENCOUNTER — Telehealth: Payer: Self-pay | Admitting: *Deleted

## 2016-09-24 LAB — IPS PAP TEST WITH HPV

## 2016-09-24 NOTE — Telephone Encounter (Signed)
Left message to call regarding results -eh 

## 2016-09-24 NOTE — Telephone Encounter (Signed)
-----   Message from Romualdo Bolk, MD sent at 09/21/2016  2:18 PM EDT ----- Please inform the patient that her lipid panel was elevated, she should return for a fasting lipid panel. Please set her up for a lab appointment.  Her vit D level was slightly low, she should start taking 1,000 IU daily, long term Her pap is pending, the rest of her lab work is normal.

## 2016-09-24 NOTE — Telephone Encounter (Signed)
Patient returning call.

## 2016-09-24 NOTE — Telephone Encounter (Signed)
Spoke with patient and went over results in detail. Patient states she had only had a banana the day she had these labs drawn. She will follow up with  Her PCP regarding the elevated Lipid panel. -eh

## 2017-08-20 ENCOUNTER — Encounter: Payer: Self-pay | Admitting: Obstetrics and Gynecology

## 2017-08-20 ENCOUNTER — Ambulatory Visit: Payer: BLUE CROSS/BLUE SHIELD | Admitting: Obstetrics and Gynecology

## 2017-08-20 ENCOUNTER — Other Ambulatory Visit: Payer: Self-pay

## 2017-08-20 VITALS — BP 118/80 | HR 88 | Resp 16 | Wt 173.0 lb

## 2017-08-20 DIAGNOSIS — Z113 Encounter for screening for infections with a predominantly sexual mode of transmission: Secondary | ICD-10-CM | POA: Diagnosis not present

## 2017-08-20 NOTE — Progress Notes (Signed)
GYNECOLOGY  VISIT   HPI: 37 y.o.   Married  Caucasian  female   G0P0000 with No LMP recorded. Patient is not currently having periods (Reason: IUD).   here for STD screening.  Husband with ED, he won't go to the MD, she is frustrated. They are very rarely sexually active.  She started having sex with a friend in January, her husband doesn't know. Not using condoms.  She is seeing a therapist. Therapist thinks she may be bipolar. She has been having some possible manic episodes.     GYNECOLOGIC HISTORY: No LMP recorded. Patient is not currently having periods (Reason: IUD). Contraception:IUD Menopausal hormone therapy: none         OB History    Gravida Para Term Preterm AB Living   0 0 0 0 0 0   SAB TAB Ectopic Multiple Live Births   0 0 0 0           Patient Active Problem List   Diagnosis Date Noted  . Dehydration with hyponatremia 01/05/2015  . SBO (small bowel obstruction) (HCC) 01/05/2015  . Small bowel obstruction (HCC) 10/19/2014  . Abdominal pain 10/19/2014  . IUD (intrauterine device) in place 02/23/2013  . Ocular migraine   . Migraine with aura   . Anxiety 09/29/2007  . Depression 09/29/2007  . GERD 09/29/2007  . IRRITABLE BOWEL SYNDROME 09/29/2007  . RUQ PAIN 09/29/2007    Past Medical History:  Diagnosis Date  . Anxiety   . Depression   . Dysmenorrhea   . IUD (intrauterine device) in place 02/2013   skyla  . Migraine with aura    "maybe monthly, if that" (01/05/2015)  . Ocular migraine    "very rare" (01/05/2015)  . SBO (small bowel obstruction) (HCC) 09/2014; 01/05/2015    Past Surgical History:  Procedure Laterality Date  . INTRAUTERINE DEVICE (IUD) INSERTION  02/2016   Kyleena  . REFRACTIVE SURGERY Bilateral 2000  . SMALL INTESTINE SURGERY  2016   . WISDOM TOOTH EXTRACTION  ~ 2000   "all 4"    Current Outpatient Medications  Medication Sig Dispense Refill  . Aspirin-Acetaminophen-Caffeine (EXCEDRIN PO) Take 1 tablet by mouth daily as needed  (for headache).     . clonazePAM (KLONOPIN) 0.5 MG tablet Take 0.5 mg by mouth daily as needed for anxiety.     . Cranberry-Vitamin C-Vitamin E (CRANBERRY PLUS VITAMIN C PO) Take by mouth.    . escitalopram (LEXAPRO) 20 MG tablet Take 20 mg by mouth daily.    . Levonorgestrel (KYLEENA) 19.5 MG IUD by Intrauterine route once.    . Melatonin 3 MG CAPS Take by mouth.    . Multiple Vitamins-Minerals (MULTIVITAMIN PO) Take 1 tablet by mouth daily.     . Probiotic Product (PROBIOTIC DAILY PO) Take by mouth.    . promethazine (PHENERGAN) 12.5 MG tablet Take 12.5 mg by mouth every 6 (six) hours as needed for nausea or vomiting.    . simethicone (GAS-X) 80 MG chewable tablet Chew 2 tablets (160 mg total) by mouth 4 (four) times daily as needed for flatulence (also available over the counter). 60 tablet 1   No current facility-administered medications for this visit.      ALLERGIES: Prednisone; Tramadol; and Sudafed [pseudoephedrine hcl]  Family History  Problem Relation Age of Onset  . Diabetes Father   . Lung cancer Maternal Uncle   . Breast cancer Paternal Aunt   . Breast cancer Maternal Grandmother     Social  History   Socioeconomic History  . Marital status: Married    Spouse name: Not on file  . Number of children: Not on file  . Years of education: Not on file  . Highest education level: Not on file  Social Needs  . Financial resource strain: Not on file  . Food insecurity - worry: Not on file  . Food insecurity - inability: Not on file  . Transportation needs - medical: Not on file  . Transportation needs - non-medical: Not on file  Occupational History  . Not on file  Tobacco Use  . Smoking status: Current Some Day Smoker    Types: Cigarettes  . Smokeless tobacco: Never Used  . Tobacco comment: smokes socially   Substance and Sexual Activity  . Alcohol use: Yes    Alcohol/week: 1.0 oz    Types: 2 Standard drinks or equivalent per week    Comment: 01/05/2015 "no alcohol  since January; before that maybe 2 drinks q other week, beer/wine/or liquor"  . Drug use: No  . Sexual activity: Yes    Partners: Male    Birth control/protection: IUD    Comment: Kyleena  Other Topics Concern  . Not on file  Social History Narrative  . Not on file    Review of Systems  Constitutional: Negative.   HENT: Negative.   Eyes: Negative.   Respiratory: Negative.   Cardiovascular: Negative.   Gastrointestinal: Negative.   Genitourinary: Negative.   Musculoskeletal: Negative.   Skin: Negative.   Neurological: Negative.   Psychiatric/Behavioral: Negative.     PHYSICAL EXAMINATION:    BP 118/80 (BP Location: Right Arm, Patient Position: Sitting, Cuff Size: Normal)   Pulse 88   Resp 16   Wt 173 lb (78.5 kg)   BMI 32.16 kg/m     General appearance: alert, cooperative and appears stated age  Pelvic: External genitalia:  no lesions              Urethra:  normal appearing urethra with no masses, tenderness or lesions              Bartholins and Skenes: normal                 Vagina: normal appearing vagina with normal color and discharge, no lesions              Cervix: no lesions and IUD string 2-3 cm              Bimanual Exam:  Uterus:  normal size, contour, position, consistency, mobility, non-tender              Adnexa: no mass, fullness, tenderness               Chaperone was present for exam.  ASSESSMENT Screening STD Marital issues    PLAN STD testing F/U for annual exam in 4/19 Call with any concerns   An After Visit Summary was printed and given to the patient.  ~15 minutes face to face time of which over 50% was spent in counseling.

## 2017-08-21 LAB — HEP, RPR, HIV PANEL
HIV Screen 4th Generation wRfx: NONREACTIVE
Hepatitis B Surface Ag: NEGATIVE
RPR: NONREACTIVE

## 2017-08-21 LAB — GC/CHLAMYDIA PROBE AMP
CHLAMYDIA, DNA PROBE: NEGATIVE
Neisseria gonorrhoeae by PCR: NEGATIVE

## 2017-08-21 LAB — HEPATITIS C ANTIBODY: Hep C Virus Ab: <0.1 s/co ratio (ref 0.0–0.9)

## 2017-09-23 ENCOUNTER — Ambulatory Visit: Payer: BLUE CROSS/BLUE SHIELD | Admitting: Obstetrics and Gynecology

## 2017-09-23 ENCOUNTER — Other Ambulatory Visit: Payer: Self-pay

## 2017-09-23 ENCOUNTER — Encounter: Payer: Self-pay | Admitting: Obstetrics and Gynecology

## 2017-09-23 VITALS — BP 128/80 | HR 80 | Resp 16 | Ht 61.25 in | Wt 171.0 lb

## 2017-09-23 DIAGNOSIS — Z01419 Encounter for gynecological examination (general) (routine) without abnormal findings: Secondary | ICD-10-CM

## 2017-09-23 DIAGNOSIS — Z23 Encounter for immunization: Secondary | ICD-10-CM

## 2017-09-23 DIAGNOSIS — E559 Vitamin D deficiency, unspecified: Secondary | ICD-10-CM | POA: Diagnosis not present

## 2017-09-23 DIAGNOSIS — Z30431 Encounter for routine checking of intrauterine contraceptive device: Secondary | ICD-10-CM | POA: Diagnosis not present

## 2017-09-23 DIAGNOSIS — Z7185 Encounter for immunization safety counseling: Secondary | ICD-10-CM

## 2017-09-23 DIAGNOSIS — E785 Hyperlipidemia, unspecified: Secondary | ICD-10-CM | POA: Diagnosis not present

## 2017-09-23 DIAGNOSIS — Z7189 Other specified counseling: Secondary | ICD-10-CM | POA: Diagnosis not present

## 2017-09-23 DIAGNOSIS — Z0000 Encounter for general adult medical examination without abnormal findings: Secondary | ICD-10-CM | POA: Diagnosis not present

## 2017-09-23 DIAGNOSIS — E663 Overweight: Secondary | ICD-10-CM | POA: Diagnosis not present

## 2017-09-23 NOTE — Patient Instructions (Signed)
EXERCISE AND DIET:  We recommended that you start or continue a regular exercise program for good health. Regular exercise means any activity that makes your heart beat faster and makes you sweat.  We recommend exercising at least 30 minutes per day at least 3 days a week, preferably 4 or 5.  We also recommend a diet low in fat and sugar.  Inactivity, poor dietary choices and obesity can cause diabetes, heart attack, stroke, and kidney damage, among others.    ALCOHOL AND SMOKING:  Women should limit their alcohol intake to no more than 7 drinks/beers/glasses of wine (combined, not each!) per week. Moderation of alcohol intake to this level decreases your risk of breast cancer and liver damage. And of course, no recreational drugs are part of a healthy lifestyle.  And absolutely no smoking or even second hand smoke. Most people know smoking can cause heart and lung diseases, but did you know it also contributes to weakening of your bones? Aging of your skin?  Yellowing of your teeth and nails?  CALCIUM AND VITAMIN D:  Adequate intake of calcium and Vitamin D are recommended.  The recommendations for exact amounts of these supplements seem to change often, but generally speaking 600 mg of calcium (either carbonate or citrate) and 800 units of Vitamin D per day seems prudent. Certain women may benefit from higher intake of Vitamin D.  If you are among these women, your doctor will have told you during your visit.    PAP SMEARS:  Pap smears, to check for cervical cancer or precancers,  have traditionally been done yearly, although recent scientific advances have shown that most women can have pap smears less often.  However, every woman still should have a physical exam from her gynecologist every year. It will include a breast check, inspection of the vulva and vagina to check for abnormal growths or skin changes, a visual exam of the cervix, and then an exam to evaluate the size and shape of the uterus and  ovaries.  And after 40 years of age, a rectal exam is indicated to check for rectal cancers. We will also provide age appropriate advice regarding health maintenance, like when you should have certain vaccines, screening for sexually transmitted diseases, bone density testing, colonoscopy, mammograms, etc.   MAMMOGRAMS:  All women over 40 years old should have a yearly mammogram. Many facilities now offer a "3D" mammogram, which may cost around $50 extra out of pocket. If possible,  we recommend you accept the option to have the 3D mammogram performed.  It both reduces the number of women who will be called back for extra views which then turn out to be normal, and it is better than the routine mammogram at detecting truly abnormal areas.    COLONOSCOPY:  Colonoscopy to screen for colon cancer is recommended for all women at age 50.  We know, you hate the idea of the prep.  We agree, BUT, having colon cancer and not knowing it is worse!!  Colon cancer so often starts as a polyp that can be seen and removed at colonscopy, which can quite literally save your life!  And if your first colonoscopy is normal and you have no family history of colon cancer, most women don't have to have it again for 10 years.  Once every ten years, you can do something that may end up saving your life, right?  We will be happy to help you get it scheduled when you are ready.    Be sure to check your insurance coverage so you understand how much it will cost.  It may be covered as a preventative service at no cost, but you should check your particular policy.      Breast Self-Awareness Breast self-awareness means being familiar with how your breasts look and feel. It involves checking your breasts regularly and reporting any changes to your health care provider. Practicing breast self-awareness is important. A change in your breasts can be a sign of a serious medical problem. Being familiar with how your breasts look and feel allows  you to find any problems early, when treatment is more likely to be successful. All women should practice breast self-awareness, including women who have had breast implants. How to do a breast self-exam One way to learn what is normal for your breasts and whether your breasts are changing is to do a breast self-exam. To do a breast self-exam: Look for Changes  1. Remove all the clothing above your waist. 2. Stand in front of a mirror in a room with good lighting. 3. Put your hands on your hips. 4. Push your hands firmly downward. 5. Compare your breasts in the mirror. Look for differences between them (asymmetry), such as: ? Differences in shape. ? Differences in size. ? Puckers, dips, and bumps in one breast and not the other. 6. Look at each breast for changes in your skin, such as: ? Redness. ? Scaly areas. 7. Look for changes in your nipples, such as: ? Discharge. ? Bleeding. ? Dimpling. ? Redness. ? A change in position. Feel for Changes  Carefully feel your breasts for lumps and changes. It is best to do this while lying on your back on the floor and again while sitting or standing in the shower or tub with soapy water on your skin. Feel each breast in the following way:  Place the arm on the side of the breast you are examining above your head.  Feel your breast with the other hand.  Start in the nipple area and make  inch (2 cm) overlapping circles to feel your breast. Use the pads of your three middle fingers to do this. Apply light pressure, then medium pressure, then firm pressure. The light pressure will allow you to feel the tissue closest to the skin. The medium pressure will allow you to feel the tissue that is a little deeper. The firm pressure will allow you to feel the tissue close to the ribs.  Continue the overlapping circles, moving downward over the breast until you feel your ribs below your breast.  Move one finger-width toward the center of the body.  Continue to use the  inch (2 cm) overlapping circles to feel your breast as you move slowly up toward your collarbone.  Continue the up and down exam using all three pressures until you reach your armpit.  Write Down What You Find  Write down what is normal for each breast and any changes that you find. Keep a written record with breast changes or normal findings for each breast. By writing this information down, you do not need to depend only on memory for size, tenderness, or location. Write down where you are in your menstrual cycle, if you are still menstruating. If you are having trouble noticing differences in your breasts, do not get discouraged. With time you will become more familiar with the variations in your breasts and more comfortable with the exam. How often should I examine my breasts? Examine   your breasts every month. If you are breastfeeding, the best time to examine your breasts is after a feeding or after using a breast pump. If you menstruate, the best time to examine your breasts is 5-7 days after your period is over. During your period, your breasts are lumpier, and it may be more difficult to notice changes. When should I see my health care provider? See your health care provider if you notice:  A change in shape or size of your breasts or nipples.  A change in the skin of your breast or nipples, such as a reddened or scaly area.  Unusual discharge from your nipples.  A lump or thick area that was not there before.  Pain in your breasts.  Anything that concerns you.  This information is not intended to replace advice given to you by your health care provider. Make sure you discuss any questions you have with your health care provider. Document Released: 06/11/2005 Document Revised: 11/17/2015 Document Reviewed: 05/01/2015 [employer]vier Interactive Patient Education  2018 [employer]  

## 2017-09-23 NOTE — Progress Notes (Signed)
37 y.o. G0P0000 MarriedCaucasianF here for annual exam.   Husband with ED, rarely sexually active. She was seen last month for STD testing, she became sexually active with a friend. The testing was negative.  Husband is suspicious. They are talking about separating, may see a counselor. They are communicating better recently.  She is seeing her own therapist. Therapist thinks she may be bipolar.  She has a kyleena, placed 9/17. No cycles.     No LMP recorded. (Menstrual status: IUD).          Sexually active: Yes.    The current method of family planning is IUD.    Exercising: Yes.    walking/ hiking Smoker:  Yes, intermittently.   Health Maintenance: Pap:  09-20-16 WNL NEG HR HPV 01-15-13 WNL NEG HR HPV  History of abnormal Pap:  no MMG:  Never Colonoscopy:  Never BMD:   Never TDaP:  2011  Gardasil: no    reports that she has been smoking cigarettes.  She has never used smokeless tobacco. She reports that she drinks about 1.8 - 3.0 oz of alcohol per week. She reports that she does not use drugs. 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    Past Medical History:  Diagnosis Date  . Anxiety   . Depression   . Dysmenorrhea   . IUD (intrauterine device) in place 02/2013   skyla  . Migraine with aura    "maybe monthly, if that" (01/05/2015)  . Ocular migraine    "very rare" (01/05/2015)  . SBO (small bowel obstruction) (HCC) 09/2014; 01/05/2015    Past Surgical History:  Procedure Laterality Date  . INTRAUTERINE DEVICE (IUD) INSERTION  02/2016   Kyleena  . REFRACTIVE SURGERY Bilateral 2000  . SMALL INTESTINE SURGERY  2016   . WISDOM TOOTH EXTRACTION  ~ 2000   "all 4"    Current Outpatient Medications  Medication Sig Dispense Refill  . Aspirin-Acetaminophen-Caffeine (EXCEDRIN PO) Take 1 tablet by mouth daily as needed (for headache).     . clonazePAM (KLONOPIN) 0.5 MG tablet Take 0.5 mg by mouth daily as needed for anxiety.     . Cranberry-Vitamin C-Vitamin E  (CRANBERRY PLUS VITAMIN C PO) Take by mouth.    . escitalopram (LEXAPRO) 20 MG tablet Take 20 mg by mouth daily.    . Levonorgestrel (KYLEENA) 19.5 MG IUD by Intrauterine route once.    . Melatonin 3 MG CAPS Take by mouth.    . Multiple Vitamins-Minerals (MULTIVITAMIN PO) Take 1 tablet by mouth daily.     . Probiotic Product (PROBIOTIC DAILY PO) Take by mouth.    . promethazine (PHENERGAN) 12.5 MG tablet Take 12.5 mg by mouth every 6 (six) hours as needed for nausea or vomiting.    . simethicone (GAS-X) 80 MG chewable tablet Chew 2 tablets (160 mg total) by mouth 4 (four) times daily as needed for flatulence (also available over the counter). 60 tablet 1   No current facility-administered medications for this visit.     Family History  Problem Relation Age of Onset  . Diabetes Father   . Lung cancer Maternal Uncle   . Breast cancer Paternal Aunt   . Breast cancer Maternal Grandmother     Review of Systems  Constitutional: Negative.   HENT: Positive for sinus pain.   Eyes: Negative.   Respiratory: Negative.   Cardiovascular: Negative.   Gastrointestinal: Negative.   Endocrine: Negative.   Genitourinary: Negative.   Musculoskeletal: Negative.  Skin: Negative.   Allergic/Immunologic: Negative.   Neurological: Negative.   Psychiatric/Behavioral: Negative.     Exam:   BP 128/80 (BP Location: Right Arm, Patient Position: Sitting, Cuff Size: Normal)   Pulse 80   Resp 16   Ht 5' 1.25" (1.556 m)   Wt 171 lb (77.6 kg)   BMI 32.05 kg/m   Weight change: @WEIGHTCHANGE @ Height:   Height: 5' 1.25" (155.6 cm)  Ht Readings from Last 3 Encounters:  09/23/17 5' 1.25" (1.556 m)  09/20/16 5' 1.5" (1.562 m)  02/29/16 5' 1.25" (1.556 m)    General appearance: alert, cooperative and appears stated age Head: Normocephalic, without obvious abnormality, atraumatic Neck: no adenopathy, supple, symmetrical, trachea midline and thyroid normal to inspection and palpation Lungs: clear to  auscultation bilaterally Cardiovascular: regular rate and rhythm Breasts: normal appearance, no masses or tenderness Abdomen: soft, non-tender; non distended,  no masses,  no organomegaly Extremities: extremities normal, atraumatic, no cyanosis or edema Skin: Skin color, texture, turgor normal. No rashes or lesions Lymph nodes: Cervical, supraclavicular, and axillary nodes normal. No abnormal inguinal nodes palpated Neurologic: Grossly normal   Pelvic: External genitalia:  no lesions              Urethra:  normal appearing urethra with no masses, tenderness or lesions              Bartholins and Skenes: normal                 Vagina: normal appearing vagina with normal color and discharge, no lesions              Cervix: no lesions, IUD strings 2-3 cm               Bimanual Exam:  Uterus:  normal size, contour, position, consistency, mobility, non-tender              Adnexa: no mass, fullness, tenderness               Rectovaginal: Confirms               Anus:  normal sphincter tone, no lesions  Chaperone was present for exam.  A:  Well Woman with normal exam  Anxiety/depression, typically well controlled on the lexapro. Being evaluated for bipolar  Having marital issues, having an affair  IUD check, in place, doing well  H/O vit d def  H/O elevated lipids  Overweight, gaining weight  P:   No pap this year  Discussed breast self exam  Discussed calcium and vit D intake  Return for fasting labs, including vit D, HgbA1C, TSH  Encouraged to use condoms, recent negative STD testing  Start the gardasil series today  Discussed eating healthy and exercise

## 2017-10-03 ENCOUNTER — Other Ambulatory Visit: Payer: BLUE CROSS/BLUE SHIELD

## 2017-10-04 ENCOUNTER — Other Ambulatory Visit (INDEPENDENT_AMBULATORY_CARE_PROVIDER_SITE_OTHER): Payer: BLUE CROSS/BLUE SHIELD

## 2017-10-04 DIAGNOSIS — E663 Overweight: Secondary | ICD-10-CM

## 2017-10-04 DIAGNOSIS — Z0000 Encounter for general adult medical examination without abnormal findings: Secondary | ICD-10-CM

## 2017-10-04 DIAGNOSIS — E559 Vitamin D deficiency, unspecified: Secondary | ICD-10-CM

## 2017-10-04 DIAGNOSIS — E785 Hyperlipidemia, unspecified: Secondary | ICD-10-CM

## 2017-10-05 LAB — COMPREHENSIVE METABOLIC PANEL
ALT: 25 IU/L (ref 0–32)
AST: 23 IU/L (ref 0–40)
Albumin/Globulin Ratio: 1.6 (ref 1.2–2.2)
Albumin: 4.5 g/dL (ref 3.5–5.5)
Alkaline Phosphatase: 76 IU/L (ref 39–117)
BUN/Creatinine Ratio: 14 (ref 9–23)
BUN: 12 mg/dL (ref 6–20)
Bilirubin Total: 0.4 mg/dL (ref 0.0–1.2)
CALCIUM: 9.5 mg/dL (ref 8.7–10.2)
CO2: 26 mmol/L (ref 20–29)
CREATININE: 0.84 mg/dL (ref 0.57–1.00)
Chloride: 101 mmol/L (ref 96–106)
GFR, EST AFRICAN AMERICAN: 102 mL/min/{1.73_m2} (ref >59)
GFR, EST NON AFRICAN AMERICAN: 88 mL/min/{1.73_m2} (ref >59)
GLUCOSE: 87 mg/dL (ref 65–99)
Globulin, Total: 2.9 g/dL (ref 1.5–4.5)
Potassium: 4.5 mmol/L (ref 3.5–5.2)
Sodium: 142 mmol/L (ref 134–144)
TOTAL PROTEIN: 7.4 g/dL (ref 6.0–8.5)

## 2017-10-05 LAB — CBC
Hematocrit: 41 % (ref 34.0–46.6)
Hemoglobin: 13.6 g/dL (ref 11.1–15.9)
MCH: 28.8 pg (ref 26.6–33.0)
MCHC: 33.2 g/dL (ref 31.5–35.7)
MCV: 87 fL (ref 79–97)
Platelets: 265 10*3/uL (ref 150–379)
RBC: 4.72 x10E6/uL (ref 3.77–5.28)
RDW: 14.3 % (ref 12.3–15.4)
WBC: 7.3 10*3/uL (ref 3.4–10.8)

## 2017-10-05 LAB — VITAMIN D 25 HYDROXY (VIT D DEFICIENCY, FRACTURES): VIT D 25 HYDROXY: 24.7 ng/mL — AB (ref 30.0–100.0)

## 2017-10-05 LAB — LIPID PANEL
CHOLESTEROL TOTAL: 262 mg/dL — AB (ref 100–199)
Chol/HDL Ratio: 3.8 ratio (ref 0.0–4.4)
HDL: 69 mg/dL (ref >39)
LDL Calculated: 150 mg/dL — ABNORMAL HIGH (ref 0–99)
Triglycerides: 217 mg/dL — ABNORMAL HIGH (ref 0–149)
VLDL CHOLESTEROL CAL: 43 mg/dL — AB (ref 5–40)

## 2017-10-05 LAB — TSH: TSH: 4.72 u[IU]/mL — AB (ref 0.450–4.500)

## 2017-10-05 LAB — HEMOGLOBIN A1C
Est. average glucose Bld gHb Est-mCnc: 105 mg/dL
Hgb A1c MFr Bld: 5.3 % (ref 4.8–5.6)

## 2017-10-09 LAB — T4, FREE: FREE T4: 1.11 ng/dL (ref 0.82–1.77)

## 2017-10-09 LAB — T3, FREE: T3 FREE: 2.9 pg/mL (ref 2.0–4.4)

## 2017-10-09 LAB — SPECIMEN STATUS REPORT

## 2017-11-25 ENCOUNTER — Ambulatory Visit (INDEPENDENT_AMBULATORY_CARE_PROVIDER_SITE_OTHER): Payer: BLUE CROSS/BLUE SHIELD

## 2017-11-25 VITALS — BP 118/70 | HR 80 | Ht 61.25 in | Wt 169.2 lb

## 2017-11-25 DIAGNOSIS — Z23 Encounter for immunization: Secondary | ICD-10-CM | POA: Diagnosis not present

## 2017-11-25 NOTE — Progress Notes (Signed)
Patient in today for 2nd Gardasil injection.   Contraception: Rutha Bouchard IUD LMP: 10-26-17 spotting Last AEX: 09-23-17 with Dr.Jertson  Injection given in Right deltoid. Patient tolerated shot well.   Patient informed next injection due in about 4 months and she already has appointment.  Advised patient, if not on birth control, to return for next injection with cycle.   Routed to provider for final review.  Encounter closed.

## 2018-03-27 ENCOUNTER — Ambulatory Visit: Payer: BLUE CROSS/BLUE SHIELD

## 2018-04-03 NOTE — Progress Notes (Signed)
Patient in today for 3rdGardasil injection.   Contraception: Kyleena IUD  LMP:iud Lastt AEX: 09/23/17  With JJ  Injection given in Left deltoid . Patient tolerated shot well.   Patient informed this is the last injection in the series   Advised patient, if not on birth control, to return for next injection with cycle.   Routed to provider for final review.  Encounter closed.

## 2018-04-08 ENCOUNTER — Ambulatory Visit (INDEPENDENT_AMBULATORY_CARE_PROVIDER_SITE_OTHER): Payer: BLUE CROSS/BLUE SHIELD

## 2018-04-08 VITALS — BP 110/64 | HR 66 | Resp 14 | Ht 61.25 in | Wt 170.4 lb

## 2018-04-08 DIAGNOSIS — Z23 Encounter for immunization: Secondary | ICD-10-CM | POA: Diagnosis not present

## 2018-08-06 ENCOUNTER — Telehealth: Payer: Self-pay | Admitting: Obstetrics and Gynecology

## 2018-08-06 NOTE — Telephone Encounter (Signed)
Patient called and left a message after hours cancelling her upcoming annual exam with Dr. Oscar La on 10/13/18 due to having new insurance that requires she is seen in another health system specifically so she will be transferring care. I returned her call and left a message that her appointment has been cancelled as requested. 02 recall removed.

## 2018-10-13 ENCOUNTER — Ambulatory Visit: Payer: BLUE CROSS/BLUE SHIELD | Admitting: Obstetrics and Gynecology

## 2021-08-01 ENCOUNTER — Encounter (HOSPITAL_COMMUNITY): Payer: Self-pay

## 2021-08-01 ENCOUNTER — Other Ambulatory Visit: Payer: Self-pay

## 2021-08-01 ENCOUNTER — Ambulatory Visit (INDEPENDENT_AMBULATORY_CARE_PROVIDER_SITE_OTHER): Payer: 59

## 2021-08-01 ENCOUNTER — Ambulatory Visit (HOSPITAL_COMMUNITY)
Admission: EM | Admit: 2021-08-01 | Discharge: 2021-08-01 | Disposition: A | Discharge status: Home / Self Care | Payer: 59 | Attending: Family Medicine | Admitting: Family Medicine

## 2021-08-01 DIAGNOSIS — S52502A Unspecified fracture of the lower end of left radius, initial encounter for closed fracture: Secondary | ICD-10-CM

## 2021-08-01 MED ORDER — HYDROCODONE-ACETAMINOPHEN 5-325 MG PO TABS
1.0000 | ORAL_TABLET | Freq: Four times a day (QID) | ORAL | 0 refills | Status: DC | PRN
Start: 2021-08-01 — End: 2022-07-11

## 2021-08-01 MED ORDER — ONDANSETRON 4 MG PO TBDP: 4.0000 mg | ORAL_TABLET | Freq: Three times a day (TID) | ORAL | 0 refills | Status: AC | PRN

## 2021-08-01 NOTE — Discharge Instructions (Signed)

## 2021-08-01 NOTE — ED Triage Notes (Signed)
Pt presents with left wrist, left knee and right ankle injury after falling sideways in her driveway path while walking her dog.

## 2021-08-02 NOTE — ED Provider Notes (Signed)
[facility]   [id number] 08/01/21 Arrival Time: 1809  ASSESSMENT & PLAN:  1. Closed fracture of distal end of left radius, unspecified fracture morphology, initial encounter    I have personally viewed the imaging studies ordered this visit. Distal non-displaced radius fracture; LEFT.  Orthopaedic tech to apply sugar-tong splint; sling.  Discharge Medication List as of 08/01/2021  7:52 PM     START taking these medications   Details  HYDROcodone-acetaminophen (NORCO/VICODIN) 5-325 MG tablet Take 1 tablet by mouth every 6 (six) hours as needed for moderate pain or severe pain., Starting Tue 08/01/2021, Normal    ondansetron (ZOFRAN-ODT) 4 MG disintegrating tablet Take 1 tablet (4 mg total) by mouth every 8 (eight) hours as needed for nausea or vomiting., Starting Tue 08/01/2021, Normal        Orders Placed This Encounter  Procedures   DG Wrist Complete Left   Apply splint short arm   Apply Shoulder Immobilizer/Sling    Recommend:  Follow-up Information     Dominica Severin, MD. Schedule an appointment as soon as possible for a visit .   Specialty: Orthopedic Surgery Contact information: [address] [place] [address] [phone]                 Reviewed expectations re: course of current medical issues. Questions answered. Outlined signs and symptoms indicating need for more acute intervention. Patient verbalized understanding. After Visit Summary given.  SUBJECTIVE: History from: patient. Shelby Ortiz is a 41 y.o. female who reports FOOSH with resulting LEFT wrist pain. Mild left knee and ankle pain but not bad. Able to bear weight without difficulty. Wrist is swollen; remains painful. No extremity sensation changes or weakness. No tx PTA.  Past Surgical History:  Procedure Laterality Date   INTRAUTERINE DEVICE (IUD) INSERTION  02/2016   Kyleena   REFRACTIVE SURGERY Bilateral 2000   SMALL INTESTINE SURGERY  2016    WISDOM  TOOTH EXTRACTION  ~ 2000   "all 4"      OBJECTIVE:  Vitals:   08/01/21 1912  BP: (!) 150/91  Pulse: 84  Resp: 18  Temp: 98.2 F (36.8 C)  TempSrc: Oral  SpO2: 98%    General appearance: alert; no distress HEENT: [place]; AT Neck: supple with FROM Resp: unlabored respirations Extremities: LUE: warm with well perfused appearance; fairly well localized moderate tenderness over left radial wrist; without gross deformities; swelling: minimal; bruising: minimal; wrist ROM: limited by reported pain CV: brisk extremity capillary refill of LUE; 2+ radial pulse of LUE. Skin: warm and dry; no visible rashes Neurologic: gait normal; normal sensation and strength of LUE Psychological: alert and cooperative; normal mood and affect  Imaging: DG Wrist Complete Left  Result Date: 08/01/2021 CLINICAL DATA:  Fall, left wrist pain EXAM: LEFT WRIST - COMPLETE 3+ VIEW COMPARISON:  None. FINDINGS: There is an acute, minimally displaced, anatomically aligned intra-articular fracture of the distal left radius extending from the medial aspect of the radial metaphyseal region to the lunate fossa of the distal radial articular surface. No articular incongruity. Normal overall alignment. No other fracture identified. IMPRESSION: Minimally displaced anatomically aligned intra-articular fracture of the distal left radius. Electronically Signed   By: Helyn Numbers M.D.   On: 08/01/2021 19:40      Allergies  Allergen Reactions   Prednisone Other (See Comments)    Blood pressure drops when tapering off of this medication. Tolerates a slow taper (decrease of 5mg  every 2 weeks)   Tramadol Nausea  And Vomiting   Sudafed [Pseudoephedrine Hcl] Anxiety    Shaking     Past Medical History:  Diagnosis Date   Anxiety    Depression    Dysmenorrhea    IUD (intrauterine device) in place 02/2013   skyla   Migraine with aura    "maybe monthly, if that" (01/05/2015)   Ocular migraine    "very rare" (01/05/2015)   SBO  (small bowel obstruction) (HCC) 09/2014; 01/05/2015   Social History   Socioeconomic History   Marital status: Married    Spouse name: Not on file   Number of children: Not on file   Years of education: Not on file   Highest education level: Not on file  Occupational History   Not on file  Tobacco Use   Smoking status: Some Days    Types: Cigarettes   Smokeless tobacco: Never   Tobacco comments:    smokes socially   Vaping Use   Vaping Use: Never used  Substance and Sexual Activity   Alcohol use: Yes    Alcohol/week: 3.0 - 5.0 standard drinks    Types: 3 - 5 Cans of beer per week   Drug use: No   Sexual activity: Yes    Partners: Male    Birth control/protection: I.U.D.    Comment: Kyleena  Other Topics Concern   Not on file  Social History Narrative   Not on file   Social Determinants of Health   Financial Resource Strain: Not on file  Food Insecurity: Not on file  Transportation Needs: Not on file  Physical Activity: Not on file  Stress: Not on file  Social Connections: Not on file   Family History  Problem Relation Age of Onset   Diabetes Father    Lung cancer Maternal Uncle    Breast cancer Paternal Aunt    Breast cancer Maternal Grandmother    Past Surgical History:  Procedure Laterality Date   INTRAUTERINE DEVICE (IUD) INSERTION  02/2016   [facility]   REFRACTIVE SURGERY Bilateral 2000   SMALL INTESTINE SURGERY  2016    WISDOM TOOTH EXTRACTION  ~ 2000   "all 4"       Mardella Layman, MD 08/02/21 [phone]

## 2022-04-22 ENCOUNTER — Ambulatory Visit (HOSPITAL_COMMUNITY)
Admission: EM | Admit: 2022-04-22 | Discharge: 2022-04-22 | Disposition: A | Discharge status: Home / Self Care | Payer: 59 | Attending: Physician Assistant | Admitting: Physician Assistant

## 2022-04-22 ENCOUNTER — Encounter (HOSPITAL_COMMUNITY): Payer: Self-pay | Admitting: Emergency Medicine

## 2022-04-22 DIAGNOSIS — J209 Acute bronchitis, unspecified: Secondary | ICD-10-CM

## 2022-04-22 MED ORDER — PREDNISONE 10 MG PO TABS: ORAL_TABLET | ORAL | 0 refills | Status: DC

## 2022-04-22 MED ORDER — ALBUTEROL SULFATE HFA 108 (90 BASE) MCG/ACT IN AERS: 2.0000 | INHALATION_SPRAY | Freq: Four times a day (QID) | RESPIRATORY_TRACT | 2 refills | Status: DC | PRN

## 2022-04-22 NOTE — ED Triage Notes (Signed)
Pt reports a productive cough and chest congestion x 3 weeks. Also endorses shortness of breath and wheezing when exhaling. Has been taking Mucinex, Tylenol, Sinus and Lung Herbal report and a decongestant.

## 2022-04-22 NOTE — ED Provider Notes (Signed)
[place]    CSN: [id number] Arrival date & time: 04/22/22  1734      History   Chief Complaint Chief Complaint  Patient presents with   Cough   Chest Congestion    HPI Shelby Ortiz is a 41 y.o. female.   Patient complains of a cough and congestion.  Patient feels like she has wheezing noises in her throat.  She reports she has had the symptoms for the past 3 weeks.  Patient reports she had green-gray production from her cough but it is now returned golden color.  The history is provided by the patient. No language interpreter was used.  Cough Cough characteristics:  Productive Sputum characteristics:  Nondescript Severity:  Moderate Duration:  3 weeks Timing:  Constant   Past Medical History:  Diagnosis Date   Anxiety    Depression    Dysmenorrhea    IUD (intrauterine device) in place 02/2013   skyla   Migraine with aura    "maybe monthly, if that" (01/05/2015)   Ocular migraine    "very rare" (01/05/2015)   SBO (small bowel obstruction) ([place]) 09/2014; 01/05/2015    Patient Active Problem List   Diagnosis Date Noted   Crohn's disease of small intestine with intestinal obstruction ([place]) 05/26/2015   Dehydration with hyponatremia 01/05/2015   SBO (small bowel obstruction) ([place]) 01/05/2015   Small bowel obstruction ([place]) 10/19/2014   Abdominal pain 10/19/2014   IUD (intrauterine device) in place 02/23/2013   Ocular migraine    Migraine with aura    Anxiety 09/29/2007   Depression 09/29/2007   GERD 09/29/2007   IRRITABLE BOWEL SYNDROME 09/29/2007   RUQ PAIN 09/29/2007    Past Surgical History:  Procedure Laterality Date   INTRAUTERINE DEVICE (IUD) INSERTION  02/2016   Kyleena   REFRACTIVE SURGERY Bilateral 2000   SMALL INTESTINE SURGERY  2016    WISDOM TOOTH EXTRACTION  ~ 2000   "all 4"    OB History     Gravida  0   Para  0   Term  0   Preterm  0   AB  0   Living  0      SAB  0   IAB  0   Ectopic  0   Multiple  0    Live Births               Home Medications    Prior to Admission medications   Medication Sig Start Date End Date Taking? Authorizing Provider  Aspirin-Acetaminophen-Caffeine (EXCEDRIN PO) Take 1 tablet by mouth daily as needed (for headache).     [provider]  Cholecalciferol (VITAMIN D) 2000 units CAPS Take 2 capsules by mouth daily.    [provider]  clonazePAM (KLONOPIN) 0.5 MG tablet Take 0.5 mg by mouth daily as needed for anxiety.     [provider]  Cranberry-Vitamin C-Vitamin E (CRANBERRY PLUS VITAMIN C PO) Take by mouth.    [provider]  escitalopram (LEXAPRO) 20 MG tablet Take 20 mg by mouth daily.    [provider]  HYDROcodone-acetaminophen (NORCO/VICODIN) 5-325 MG tablet Take 1 tablet by mouth every 6 (six) hours as needed for moderate pain or severe pain. 08/01/21   Vanessa Kick, MD  Levonorgestrel [facility]) 19.5 MG IUD by Intrauterine route once.    [provider]  Melatonin 3 MG CAPS Take by mouth.    [provider]  Multiple Vitamins-Minerals (MULTIVITAMIN PO) Take 1 tablet by  mouth daily.     [provider]  ondansetron (ZOFRAN-ODT) 4 MG disintegrating tablet Take 1 tablet (4 mg total) by mouth every 8 (eight) hours as needed for nausea or vomiting. 08/01/21   Mardella Layman, MD  Probiotic Product (PROBIOTIC DAILY PO) Take by mouth.    [provider]  promethazine (PHENERGAN) 12.5 MG tablet Take 12.5 mg by mouth every 6 (six) hours as needed for nausea or vomiting.    [provider]  ranitidine (ZANTAC) 150 MG tablet Take 150 mg by mouth 2 (two) times daily.    [provider]  simethicone (GAS-X) 80 MG chewable tablet Chew 2 tablets (160 mg total) by mouth 4 (four) times daily as needed for flatulence (also available over the counter). 10/28/14   Cathren Harsh, MD    Family History Family History  Problem Relation Age of Onset   Diabetes Father    Lung  cancer Maternal Uncle    Breast cancer Paternal Aunt    Breast cancer Maternal Grandmother     Social History Social History   Tobacco Use   Smoking status: Some Days    Types: Cigarettes   Smokeless tobacco: Never   Tobacco comments:    smokes socially   Vaping Use   Vaping Use: Never used  Substance Use Topics   Alcohol use: Yes    Alcohol/week: 3.0 - 5.0 standard drinks of alcohol    Types: 3 - 5 Cans of beer per week   Drug use: No     Allergies   Prednisone, Tramadol, and Sudafed [pseudoephedrine hcl]   Review of Systems Review of Systems  Respiratory:  Positive for cough.   All other systems reviewed and are negative.    Physical Exam Triage Vital Signs ED Triage Vitals [04/22/22 1825]  Enc Vitals Group     BP (!) 136/94     Pulse Rate 78     Resp 18     Temp 98.4 F (36.9 C)     Temp Source Oral     SpO2 94 %     Weight      Height      Head Circumference      Peak Flow      Pain Score 0     Pain Loc      Pain Edu?      Excl. in GC?    No data found.  Updated Vital Signs BP (!) 136/94 (BP Location: Right Arm)   Pulse 78   Temp 98.4 F (36.9 C) (Oral)   Resp 18   SpO2 94%   Visual Acuity Right Eye Distance:   Left Eye Distance:   Bilateral Distance:    Right Eye Near:   Left Eye Near:    Bilateral Near:     Physical Exam Vitals and nursing note reviewed.  Constitutional:      Appearance: She is well-developed.  HENT:     Head: Normocephalic.  Cardiovascular:     Rate and Rhythm: Normal rate.  Pulmonary:     Effort: Pulmonary effort is normal.     Breath sounds: Wheezing and rhonchi present.  Abdominal:     General: There is no distension.  Musculoskeletal:        General: Normal range of motion.     Cervical back: Normal range of motion.  Skin:    General: Skin is warm.  Neurological:     General: No focal deficit present.  Mental Status: She is alert and oriented to person, place, and time.  Psychiatric:         Mood and Affect: Mood normal.      UC Treatments / Results  Labs (all labs ordered are listed, but only abnormal results are displayed) Labs Reviewed - No data to display  EKG   Radiology No results found.  Procedures Procedures (including critical care time)  Medications Ordered in UC Medications - No data to display  Initial Impression / Assessment and Plan / UC Course  I have reviewed the triage vital signs and the nursing notes.  Pertinent labs & imaging results that were available during my care of the patient were reviewed by me and considered in my medical decision making (see chart for details).     MDM: Patient has faint wheezing I suspect patient may have bronchitis she is given a prescription for prednisone and an albuterol inhaler she is advised to follow-up with her physician for recheck she is to return to urgent care if she develops any increased shortness of breath or fever Final Clinical Impressions(s) / UC Diagnoses   Final diagnoses:  Acute bronchitis, unspecified organism   Discharge Instructions   None    ED Prescriptions   None    PDMP not reviewed this encounter. An After Visit Summary was printed and given to the patient.    Elson Areas, [place] 04/22/22 1846

## 2022-05-22 ENCOUNTER — Encounter: Payer: Self-pay | Admitting: Pulmonary Disease

## 2022-05-22 ENCOUNTER — Ambulatory Visit (INDEPENDENT_AMBULATORY_CARE_PROVIDER_SITE_OTHER): Payer: 59 | Admitting: Pulmonary Disease

## 2022-05-22 VITALS — BP 124/66 | HR 71 | Ht 62.0 in | Wt 195.0 lb

## 2022-05-22 DIAGNOSIS — J398 Other specified diseases of upper respiratory tract: Secondary | ICD-10-CM

## 2022-05-22 DIAGNOSIS — J4540 Moderate persistent asthma, uncomplicated: Secondary | ICD-10-CM | POA: Diagnosis not present

## 2022-05-22 MED ORDER — BUDESONIDE-FORMOTEROL FUMARATE 160-4.5 MCG/ACT IN AERO: 2.0000 | INHALATION_SPRAY | Freq: Two times a day (BID) | RESPIRATORY_TRACT | 12 refills | Status: DC

## 2022-05-22 NOTE — Progress Notes (Signed)
@Patient  ID: , female    DOB: 1981/06/24, 41 y.o.   MRN: [id number]  Chief Complaint  Patient presents with   Consult    Consult for possible asthma. Never been dx with asthma. Pt states for 6 weeks now she has been dealing with bronchitis. Pt states she has been wheezing for years. Pt is on Albuterol and states it did help with her breathing. Pt admits that its harder to breath when she is laying down at night.     Referring provider: [id number], PA  HPI:   41 y.o. woman whom are seen in consultation for evaluation of wheeze, dyspnea, cough.  Most recent PCP note reviewed.  Patient has longstanding history of the symptoms.  Primarily wheeze, cough, mild dyspnea on exertion.  There are multiple potential triggers.  Typically triggered in different environments, dusty environments particular at work.  Also worse when she lies down or supine.  Or at night.  Also at times when there are rapid swings and pressure in her chest particular with laughing.  She is been prescribed albuterol.  It helps some with this in terms of the tickle in the throat or cough and some wheeze.  She is been told her breathing is very noisy or almost gasping when she sleeps.  Over the last several months.  No relieving or exacerbating factors.  Most recent chest x-ray 2020 result reviewed that reports clear lungs bilaterally.  Currently undergoing a prolonged bout of bronchitis with worsening cough.  This happens once every few years.  Not frequently.  PMH: Seasonal allergies, chronic headaches, Crohn's disease Surgical history: Colon surgery, breast surgery Family history: First-degree relatives with breast cancer, seasonal allergies Social history: Rare and occasional cigarette use, lives in [place], works for [employer], number of [employer] / Pulmonary Flowsheets:   ACT:      No data to display          MMRC:     No data to display           Epworth:      No data to display          Tests:   FENO:  No results found for: "NITRICOXIDE"  PFT:     No data to display          WALK:      No data to display          Imaging: Personally reviewed No results found.  Lab Results: Personally reviewed  CBC    Component Value Date/Time   WBC 7.3 10/04/2017 0935   WBC 6.4 09/20/2016 1511   RBC 4.72 10/04/2017 0935   RBC 4.85 09/20/2016 1511   HGB 13.6 10/04/2017 0935   HGB 14.0 02/24/2014 1100   HCT 41.0 10/04/2017 0935   PLT 265 10/04/2017 0935   MCV 87 10/04/2017 0935   MCH 28.8 10/04/2017 0935   MCH 27.8 09/20/2016 1511   MCHC 33.2 10/04/2017 0935   MCHC 32.9 09/20/2016 1511   RDW 14.3 10/04/2017 0935   LYMPHSABS 2.1 10/28/2014 0330   MONOABS 0.5 10/28/2014 0330   EOSABS 0.0 10/28/2014 0330   BASOSABS 0.0 10/28/2014 0330    BMET    Component Value Date/Time   NA 142 10/04/2017 0935   K 4.5 10/04/2017 0935   CL 101 10/04/2017 0935   CO2 26 10/04/2017 0935   GLUCOSE 87 10/04/2017 0935   GLUCOSE 81 09/20/2016 1511  BUN 12 10/04/2017 0935   CREATININE 0.84 10/04/2017 0935   CREATININE 0.71 09/20/2016 1511   CALCIUM 9.5 10/04/2017 0935   GFRNONAA 88 10/04/2017 0935   GFRAA 102 10/04/2017 0935    BNP No results found for: "BNP"  ProBNP No results found for: "PROBNP"  Specialty Problems   None   Allergies  Allergen Reactions   Prednisone Other (See Comments)    Blood pressure drops when tapering off of this medication. Tolerates a slow taper (decrease of 5mg  every 2 weeks)   Tramadol Nausea And Vomiting   Sudafed [Pseudoephedrine Hcl] Anxiety    Shaking     Immunization History  Administered Date(s) Administered   HPV 9-valent 09/23/2017, 11/25/2017, 04/08/2018   HPV Quadrivalent 09/23/2017, 11/25/2017, 04/08/2018   Influenza Inj Mdck Quad Pf 08/04/2018   Influenza-Unspecified 07/26/2018   Tdap 05/25/2010, 12/09/2021    Past Medical History:  Diagnosis Date    Anxiety    Depression    Dysmenorrhea    IUD (intrauterine device) in place 02/2013   skyla   Migraine with aura    "maybe monthly, if that" (01/05/2015)   Ocular migraine    "very rare" (01/05/2015)   SBO (small bowel obstruction) ([facility]) 09/2014; 01/05/2015    Tobacco History: Social History   Tobacco Use  Smoking Status Some Days   Types: Cigarettes   Passive exposure: Past  Smokeless Tobacco Never  Tobacco Comments   smokes socially. Last cigarette was in Spring 2023   Ready to quit: Not Answered Counseling given: Not Answered Tobacco comments: smokes socially. Last cigarette was in Spring 2023   Continue to not smoke  Outpatient Encounter Medications as of 05/22/2022  Medication Sig   albuterol (VENTOLIN HFA) 108 (90 Base) MCG/ACT inhaler Inhale 2 puffs into the lungs every 6 (six) hours as needed for wheezing or shortness of breath.   Aspirin-Acetaminophen-Caffeine (EXCEDRIN PO) Take 1 tablet by mouth daily as needed (for headache).    Cholecalciferol (VITAMIN D) 2000 units CAPS Take 2 capsules by mouth daily.   clonazePAM (KLONOPIN) 0.5 MG tablet Take 0.5 mg by mouth daily as needed for anxiety.    Cranberry-Vitamin C-Vitamin E (CRANBERRY PLUS VITAMIN C PO) Take by mouth.   escitalopram (LEXAPRO) 20 MG tablet Take 20 mg by mouth daily.   HYDROcodone-acetaminophen (NORCO/VICODIN) 5-325 MG tablet Take 1 tablet by mouth every 6 (six) hours as needed for moderate pain or severe pain.   Levonorgestrel (KYLEENA) 19.5 MG IUD by Intrauterine route once.   Melatonin 3 MG CAPS Take by mouth.   Multiple Vitamins-Minerals (MULTIVITAMIN PO) Take 1 tablet by mouth daily.    ondansetron (ZOFRAN-ODT) 4 MG disintegrating tablet Take 1 tablet (4 mg total) by mouth every 8 (eight) hours as needed for nausea or vomiting.   Probiotic Product (PROBIOTIC DAILY PO) Take by mouth.   promethazine (PHENERGAN) 12.5 MG tablet Take 12.5 mg by mouth every 6 (six) hours as needed for nausea or vomiting.    ranitidine (ZANTAC) 150 MG tablet Take 150 mg by mouth 2 (two) times daily.   simethicone (GAS-X) 80 MG chewable tablet Chew 2 tablets (160 mg total) by mouth 4 (four) times daily as needed for flatulence (also available over the counter).   [DISCONTINUED] predniSONE (DELTASONE) 10 MG tablet [id number] taper   No facility-administered encounter medications on file as of 05/22/2022.     Review of Systems  Review of Systems  No chest pain with exertion.  Shortness of breath worse when lying supine.  No worsening lower extremity swelling awakening.  No significant dyspnea on exertion reliably.  Comprehensive review of systems otherwise negative. Physical Exam  Pulse 71   Ht 5\' 2"  (1.575 m)   Wt 195 lb (88.5 kg)   SpO2 95%   BMI 35.67 kg/m   Wt Readings from Last 5 Encounters:  05/22/22 195 lb (88.5 kg)  04/08/18 170 lb 6.4 oz (77.3 kg)  11/25/17 169 lb 3.2 oz (76.7 kg)  09/23/17 171 lb (77.6 kg)  08/20/17 173 lb (78.5 kg)    BMI Readings from Last 5 Encounters:  05/22/22 35.67 kg/m  04/08/18 31.93 kg/m  11/25/17 31.71 kg/m  09/23/17 32.05 kg/m  08/20/17 32.16 kg/m     Physical Exam General: Sitting in chair, no acute distress Eyes: EOMI, no icterus Neck: Supple, no JVP Pulmonary: Clear, normal work of breathing Cardiovascular: Warm, trace edema of the ankles Abdomen: Nondistended, bowel sounds present MSK: No synovitis, no joint effusion Neuro: Normal gait, no weakness Psych: Normal mood, full affect  Assessment & Plan:   Prolonged bronchitis: Cough, wheeze, persisting for weeks.  Possible subtle sign of underlying reactive airway disease/asthma.  Pursuing chest imaging.  See below.  Asthma: Based on prolonged bronchitis and improvement with bronchodilators.  As well as atopic symptoms, seasonal allergies.  In addition, import environmental triggers of symptoms.  Improvement with albuterol.  Start Symbicort high-dose for ICS/LABA therapy, 2 puff twice daily.   Assess response.  Orthopnea/wheeze: May be related to poorly controlled asthma symptoms.  Possible sign of cardiac dysfunction versus tracheobronchial malacia.  She denies significant lower extremity swelling or dyspnea suggest cardiac dysfunction.  Start evaluation with high-res CT scan of the chest to evaluate for tracheobronchomalacia.  Return in about 3 months (around 08/22/2022).   08/24/2022, MD 05/22/2022

## 2022-05-22 NOTE — Patient Instructions (Signed)
Nice to meet you  Since the albuterol has helped some with your symptoms, I recommend we continue down the pathway of inhalers to treat something like asthma.  Use a new inhaler to Symbicort.  2 puffs twice a day every day.  Rinse your mouth out with water after every use.  This helps prevent buildup of the steroid in your mouth that can cause thrush.  If the inhaler is too expensive, please let me know we will look for a more cost effective solution.  Given your description of symptoms at night/lying down and with laughing, it raises suspicion for something called tracheobronchial malacia.  This is when the front rigid part of the airways lose the structural integrity and can go in when you breathe out.  To further evaluate this, I have ordered a CT scan of the chest that we will take images when you breathe and then breathe out to see if we see signs of this on the pictures.  We will also give [place] a very good picture of your lungs to make sure is nothing else contributing to your cough in the lungs.  Return to clinic in 2 months or sooner if needed with Dr. Judeth Horn

## 2022-06-21 ENCOUNTER — Ambulatory Visit
Admission: RE | Admit: 2022-06-21 | Discharge: 2022-06-21 | Disposition: A | Discharge status: Home / Self Care | Payer: 59 | Source: Ambulatory Visit | Attending: Pulmonary Disease | Admitting: Pulmonary Disease

## 2022-06-21 DIAGNOSIS — J398 Other specified diseases of upper respiratory tract: Secondary | ICD-10-CM

## 2022-06-26 NOTE — Progress Notes (Signed)
No evidence of tracheobronchomalacia or intra lung disease.  Does show some signs of possible underlying small airways disease or asthma.

## 2022-07-11 ENCOUNTER — Ambulatory Visit: Payer: 59 | Admitting: Pulmonary Disease

## 2022-07-11 ENCOUNTER — Encounter: Payer: Self-pay | Admitting: Pulmonary Disease

## 2022-07-11 VITALS — BP 126/68 | HR 89 | Wt 203.4 lb

## 2022-07-11 DIAGNOSIS — J4540 Moderate persistent asthma, uncomplicated: Secondary | ICD-10-CM

## 2022-07-11 MED ORDER — FLUTICASONE PROPIONATE 50 MCG/ACT NA SUSP: 1.0000 | Freq: Every day | NASAL | 2 refills | Status: AC

## 2022-07-11 NOTE — Patient Instructions (Addendum)
Nice to see you again  No changes to inhalers  Try the NeilMed sinus rinse twice a day for 1 week then once a day thereafter  Follow the sinus rinse with Flonase 1 spray each nostril twice a day for 1 week and once a day thereafter  Use Afrin after Flonase, 2 sprays each nostril twice a day for 3 days then stop  Lets see if this helps with the congestion in the sinuses, nose  Return to clinic in 6 months or sooner if needed with Dr. Silas Flood

## 2022-07-13 NOTE — Progress Notes (Signed)
@Patient  ID: Shelby Ortiz, female    DOB: 26-Dec-1980, 42 y.o.   MRN: [id number]  Chief Complaint  Patient presents with   Follow-up    Follow up for tracheobronchomalacia. Pt had CT scan 06/24/22. Pt states that she is breathing better. Pt states she is having issues rememebering to do the Symbicort 2 times a day. Albuterol as needed     Referring provider: No ref. provider found  HPI:   42 y.o. woman whom are seeing in follow-up for evaluation of wheeze, dyspnea, cough.    Returns for routine follow-up.  At last visit concern for asthma versus tracheobronchomalacia.  CT high-resolution was obtained.  On my review interpretation shows no tracheobronchomalacia.  Does show signs of air trapping, bronchial thickening further increasing suspicion for asthma.  At last visit was placed on ICS/LABA therapy.  This is helped.  Still has some nasal congestion with a postnasal drip and cough but by enlarge cough is markedly improved.  HPI initial visit: Patient has longstanding history of the symptoms.  Primarily wheeze, cough, mild dyspnea on exertion.  There are multiple potential triggers.  Typically triggered in different environments, dusty environments particular at work.  Also worse when she lies down or supine.  Or at night.  Also at times when there are rapid swings and pressure in her chest particular with laughing.  She is been prescribed albuterol.  It helps some with this in terms of the tickle in the throat or cough and some wheeze.  She is been told her breathing is very noisy or almost gasping when she sleeps.  Over the last several months.  No relieving or exacerbating factors.  Most recent chest x-ray 2020 result reviewed that reports clear lungs bilaterally.  Currently undergoing a prolonged bout of bronchitis with worsening cough.  This happens once every few years.  Not frequently.  PMH: Seasonal allergies, chronic headaches, Crohn's disease Surgical history: Colon surgery, breast  surgery Family history: First-degree relatives with breast cancer, seasonal allergies Social history: Rare and occasional cigarette use, lives in [place], works for [employer], number of Teacher, adult education / Pulmonary Flowsheets:   ACT:      No data to display           MMRC:     No data to display           Epworth:      No data to display           Tests:   FENO:  No results found for: "NITRICOXIDE"  PFT:     No data to display           WALK:      No data to display           Imaging: Personally reviewed CT Chest High Resolution  Result Date: 06/24/2022 CLINICAL DATA:  Evaluate for tracheobronchomalacia, asthma EXAM: CT CHEST WITHOUT CONTRAST TECHNIQUE: Multidetector CT imaging of the chest was performed following the standard protocol without intravenous contrast. High resolution imaging of the lungs, as well as inspiratory and expiratory imaging, was performed. RADIATION DOSE REDUCTION: This exam was performed according to the departmental dose-optimization program which includes automated exposure control, adjustment of the mA and/or kV according to patient size and/or use of iterative reconstruction technique. COMPARISON:  None Available. FINDINGS: Cardiovascular: No significant vascular findings. Normal heart size. No pericardial effusion. Mediastinum/Nodes: No enlarged mediastinal, hilar, or axillary lymph nodes. Thyroid gland, trachea, and esophagus  demonstrate no significant findings. Lungs/Pleura: Mild, diffuse bilateral bronchial wall thickening. No evidence of fibrotic interstitial lung disease. Lobular air trapping on expiratory phase imaging. No evidence of tracheobronchomalacia (series 17, image 3). No pleural effusion or pneumothorax. Upper Abdomen: No acute abnormality. Hepatic steatosis. Gallstones. Musculoskeletal: No chest wall abnormality. No acute osseous findings. IMPRESSION: 1. No evidence of  tracheobronchomalacia. 2. No evidence of fibrotic interstitial lung disease. 3. Mild, diffuse bilateral bronchial wall thickening, consistent with nonspecific infectious or inflammatory bronchitis. 4. Lobular air trapping on expiratory phase imaging, consistent with small airways disease. 5. Hepatic steatosis. 6. Cholelithiasis. Electronically Signed   By: Delanna Ahmadi M.D.   On: 06/24/2022 15:24    Lab Results: Personally reviewed  CBC    Component Value Date/Time   WBC 7.3 10/04/2017 0935   WBC 6.4 09/20/2016 1511   RBC 4.72 10/04/2017 0935   RBC 4.85 09/20/2016 1511   HGB 13.6 10/04/2017 0935   HGB 14.0 02/24/2014 1100   HCT 41.0 10/04/2017 0935   PLT 265 10/04/2017 0935   MCV 87 10/04/2017 0935   MCH 28.8 10/04/2017 0935   MCH 27.8 09/20/2016 1511   MCHC 33.2 10/04/2017 0935   MCHC 32.9 09/20/2016 1511   RDW 14.3 10/04/2017 0935   LYMPHSABS 2.1 10/28/2014 0330   MONOABS 0.5 10/28/2014 0330   EOSABS 0.0 10/28/2014 0330   BASOSABS 0.0 10/28/2014 0330    BMET    Component Value Date/Time   NA 142 10/04/2017 0935   K 4.5 10/04/2017 0935   CL 101 10/04/2017 0935   CO2 26 10/04/2017 0935   GLUCOSE 87 10/04/2017 0935   GLUCOSE 81 09/20/2016 1511   BUN 12 10/04/2017 0935   CREATININE 0.84 10/04/2017 0935   CREATININE 0.71 09/20/2016 1511   CALCIUM 9.5 10/04/2017 0935   GFRNONAA 88 10/04/2017 0935   GFRAA 102 10/04/2017 0935    BNP No results found for: "BNP"  ProBNP No results found for: "PROBNP"  Specialty Problems   None   Allergies  Allergen Reactions   Prednisone Other (See Comments)    Blood pressure drops when tapering off of this medication. Tolerates a slow taper (decrease of 5mg  every 2 weeks)   Tramadol Nausea And Vomiting   Sudafed [Pseudoephedrine Hcl] Anxiety    Shaking     Immunization History  Administered Date(s) Administered   HPV 9-valent 09/23/2017, 11/25/2017, 04/08/2018   HPV Quadrivalent 09/23/2017, 11/25/2017, 04/08/2018    Influenza Inj Mdck Quad Pf 08/04/2018   Influenza-Unspecified 07/26/2018   Tdap 05/25/2010, 12/09/2021    Past Medical History:  Diagnosis Date   Anxiety    Depression    Dysmenorrhea    IUD (intrauterine device) in place 02/2013   skyla   Migraine with aura    "maybe monthly, if that" (01/05/2015)   Ocular migraine    "very rare" (01/05/2015)   SBO (small bowel obstruction) ([place]) 09/2014; 01/05/2015    Tobacco History: Social History   Tobacco Use  Smoking Status Former   Types: Cigarettes   Quit date: 04/30/2022   Years since quitting: 0.2   Passive exposure: Past  Smokeless Tobacco Never  Tobacco Comments   smokes socially. Last cigarette was in Spring 2023   Counseling given: Not Answered Tobacco comments: smokes socially. Last cigarette was in Spring 2023   Continue to not smoke  Outpatient Encounter Medications as of 07/11/2022  Medication Sig   albuterol (VENTOLIN HFA) 108 (90 Base) MCG/ACT inhaler Inhale 2 puffs into the lungs every  6 (six) hours as needed for wheezing or shortness of breath.   Aspirin-Acetaminophen-Caffeine (EXCEDRIN PO) Take 1 tablet by mouth daily as needed (for headache).    budesonide-formoterol (SYMBICORT) 160-4.5 MCG/ACT inhaler Inhale 2 puffs into the lungs in the morning and at bedtime.   Cholecalciferol (VITAMIN D) 2000 units CAPS Take 2 capsules by mouth daily.   clonazePAM (KLONOPIN) 0.5 MG tablet Take 0.5 mg by mouth daily as needed for anxiety.    Cranberry-Vitamin C-Vitamin E (CRANBERRY PLUS VITAMIN C PO) Take by mouth.   escitalopram (LEXAPRO) 20 MG tablet Take 20 mg by mouth daily.   fluticasone (FLONASE) 50 MCG/ACT nasal spray Place 1 spray into both nostrils daily.   Levonorgestrel (KYLEENA) 19.5 MG IUD by Intrauterine route once.   Melatonin 3 MG CAPS Take by mouth.   Multiple Vitamins-Minerals (MULTIVITAMIN PO) Take 1 tablet by mouth daily.    ondansetron (ZOFRAN-ODT) 4 MG disintegrating tablet Take 1 tablet (4 mg total) by  mouth every 8 (eight) hours as needed for nausea or vomiting.   Probiotic Product (PROBIOTIC DAILY PO) Take by mouth.   promethazine (PHENERGAN) 12.5 MG tablet Take 12.5 mg by mouth every 6 (six) hours as needed for nausea or vomiting.   simethicone (GAS-X) 80 MG chewable tablet Chew 2 tablets (160 mg total) by mouth 4 (four) times daily as needed for flatulence (also available over the counter).   [DISCONTINUED] ranitidine (ZANTAC) 150 MG tablet Take 150 mg by mouth 2 (two) times daily.   [DISCONTINUED] HYDROcodone-acetaminophen (NORCO/VICODIN) 5-325 MG tablet Take 1 tablet by mouth every 6 (six) hours as needed for moderate pain or severe pain.   No facility-administered encounter medications on file as of 07/11/2022.     Review of Systems  Review of Systems  No chest pain with exertion.  Shortness of breath worse when lying supine.  No worsening lower extremity swelling awakening.  No significant dyspnea on exertion reliably.  Comprehensive review of systems otherwise negative. Physical Exam  BP 126/68 (BP Location: Left Arm, Patient Position: Sitting, Cuff Size: Normal)   Pulse 89   Wt 203 lb 6.4 oz (92.3 kg)   SpO2 98%   BMI 37.20 kg/m   Wt Readings from Last 5 Encounters:  07/11/22 203 lb 6.4 oz (92.3 kg)  05/22/22 195 lb (88.5 kg)  04/08/18 170 lb 6.4 oz (77.3 kg)  11/25/17 169 lb 3.2 oz (76.7 kg)  09/23/17 171 lb (77.6 kg)    BMI Readings from Last 5 Encounters:  07/11/22 37.20 kg/m  05/22/22 35.67 kg/m  04/08/18 31.93 kg/m  11/25/17 31.71 kg/m  09/23/17 32.05 kg/m     Physical Exam General: Sitting in chair, no acute distress Eyes: EOMI, no icterus Neck: Supple, no JVP Pulmonary: Clear, normal work of breathing Cardiovascular: Warm, trace edema of the ankles Abdomen: Nondistended, bowel sounds present MSK: No synovitis, no joint effusion Neuro: Normal gait, no weakness Psych: Normal mood, full affect  Assessment & Plan:   Prolonged bronchitis: Cough,  wheeze, persisting for weeks.  Likely sign of poorly controlled asthma.  See below.  Asthma: Based on prolonged bronchitis and improvement with bronchodilators.  As well as atopic symptoms, seasonal allergies.  In addition, reported environmental triggers of symptoms.  Improvement with albuterol.  CT scan demonstrates air trapping as well as bronchial thickening also consistent.  Symptoms largely improved with Symbicort high-dose for ICS/LABA therapy, 2 puff twice daily.   Orthopnea/wheeze: Historically, likely related to poorly controlled asthma symptoms.  No tracheobronchomalacia on  CT scan 05/2022.  Improved with inhaler therapy.  Sinus congestion: Trial sinus rinses, Flonase, Afrin and assess response.  Return in about 6 months (around 01/09/2023).   Karren Burly, MD 07/13/2022

## 2023-05-10 ENCOUNTER — Telehealth (INDEPENDENT_AMBULATORY_CARE_PROVIDER_SITE_OTHER): Payer: 59 | Admitting: Pulmonary Disease

## 2023-05-10 ENCOUNTER — Encounter: Payer: Self-pay | Admitting: Pulmonary Disease

## 2023-05-10 DIAGNOSIS — J4540 Moderate persistent asthma, uncomplicated: Secondary | ICD-10-CM | POA: Insufficient documentation

## 2023-05-10 MED ORDER — BUDESONIDE-FORMOTEROL FUMARATE 160-4.5 MCG/ACT IN AERO: 2.0000 | INHALATION_SPRAY | Freq: Two times a day (BID) | RESPIRATORY_TRACT | 12 refills | Status: AC

## 2023-05-10 MED ORDER — ALBUTEROL SULFATE HFA 108 (90 BASE) MCG/ACT IN AERS: 2.0000 | INHALATION_SPRAY | Freq: Four times a day (QID) | RESPIRATORY_TRACT | 6 refills | Status: AC | PRN

## 2023-05-10 NOTE — Progress Notes (Signed)
@Patient  ID: Shelby Ortiz, female    DOB: 04-15-1981, 42 y.o.   MRN: [id number]  No chief complaint on file. CC: Shortness of breath  Referring provider: Ceasar Lund, PA  HPI:   42 y.o. woman whom are seeing in follow-up for evaluation of wheeze, dyspnea, cough felt to be related to asthma..    Returns for routine follow-up.  Overall doing well.  Asthma symptoms well-controlled on high-dose Symbicort 2 puff twice daily.  Recent travel.  Misplaced her Symbicort.  Minimal help for several days.  Has noticed a difference and some chest congestion.  Needing to use albuterol a bit more.  Limited cough.  But when she is on her Symbicort she states things are very well-controlled.  She notices that sometimes artificial smells, perfumes etc. that sometimes trigger some a tickle in her throat almost like a tightness.  Usually relieved by albuterol.  HPI initial visit: Patient has longstanding history of the symptoms.  Primarily wheeze, cough, mild dyspnea on exertion.  There are multiple potential triggers.  Typically triggered in different environments, dusty environments particular at work.  Also worse when she lies down or supine.  Or at night.  Also at times when there are rapid swings and pressure in her chest particular with laughing.  She is been prescribed albuterol.  It helps some with this in terms of the tickle in the throat or cough and some wheeze.  She is been told her breathing is very noisy or almost gasping when she sleeps.  Over the last several months.  No relieving or exacerbating factors.  Most recent chest x-ray 2020 result reviewed that reports clear lungs bilaterally.  Currently undergoing a prolonged bout of bronchitis with worsening cough.  This happens once every few years.  Not frequently.  PMH: Seasonal allergies, chronic headaches, Crohn's disease Surgical history: Colon surgery, breast surgery Family history: First-degree relatives with breast cancer, seasonal  allergies Social history: Rare and occasional cigarette use, lives in [place], works for [employer], number of Engineer, materials / Pulmonary Flowsheets:   ACT:      No data to display          MMRC:     No data to display          Epworth:      No data to display          Tests:   FENO:  No results found for: "NITRICOXIDE"  PFT:     No data to display          WALK:      No data to display          Imaging: Personally reviewed No results found.  Lab Results: Personally reviewed  CBC    Component Value Date/Time   WBC 7.3 10/04/2017 0935   WBC 6.4 09/20/2016 1511   RBC 4.72 10/04/2017 0935   RBC 4.85 09/20/2016 1511   HGB 13.6 10/04/2017 0935   HGB 14.0 02/24/2014 1100   HCT 41.0 10/04/2017 0935   PLT 265 10/04/2017 0935   MCV 87 10/04/2017 0935   MCH 28.8 10/04/2017 0935   MCH 27.8 09/20/2016 1511   MCHC 33.2 10/04/2017 0935   MCHC 32.9 09/20/2016 1511   RDW 14.3 10/04/2017 0935   LYMPHSABS 2.1 10/28/2014 0330   MONOABS 0.5 10/28/2014 0330   EOSABS 0.0 10/28/2014 0330   BASOSABS 0.0 10/28/2014 0330    BMET    Component Value  Date/Time   NA 142 10/04/2017 0935   K 4.5 10/04/2017 0935   CL 101 10/04/2017 0935   CO2 26 10/04/2017 0935   GLUCOSE 87 10/04/2017 0935   GLUCOSE 81 09/20/2016 1511   BUN 12 10/04/2017 0935   CREATININE 0.84 10/04/2017 0935   CREATININE 0.71 09/20/2016 1511   CALCIUM 9.5 10/04/2017 0935   GFRNONAA 88 10/04/2017 0935   GFRAA 102 10/04/2017 0935    BNP No results found for: "BNP"  ProBNP No results found for: "PROBNP"  Specialty Problems       Pulmonary Problems   Moderate persistent asthma without complication    Allergies  Allergen Reactions   Prednisone Other (See Comments)    Blood pressure drops when tapering off of this medication. Tolerates a slow taper (decrease of 5mg  every 2 weeks)   Tramadol Nausea And Vomiting   Sudafed [Pseudoephedrine  Hcl] Anxiety    Shaking     Immunization History  Administered Date(s) Administered   HPV 9-valent 09/23/2017, 11/25/2017, 04/08/2018   HPV Quadrivalent 09/23/2017, 11/25/2017, 04/08/2018   Influenza Inj Mdck Quad Pf 08/04/2018   Influenza-Unspecified 07/26/2018   Tdap 05/25/2010, 12/09/2021    Past Medical History:  Diagnosis Date   Anxiety    Depression    Dysmenorrhea    IUD (intrauterine device) in place 02/2013   skyla   Migraine with aura    "maybe monthly, if that" (01/05/2015)   Ocular migraine    "very rare" (01/05/2015)   SBO (small bowel obstruction) (HCC) 09/2014; 01/05/2015    Tobacco History: Social History   Tobacco Use  Smoking Status Former   Current packs/day: 0.00   Types: Cigarettes   Quit date: 04/30/2022   Years since quitting: 1.0   Passive exposure: Past  Smokeless Tobacco Never  Tobacco Comments   smokes socially. Last cigarette was in Spring 2023   Counseling given: Not Answered Tobacco comments: smokes socially. Last cigarette was in Spring 2023   Continue to not smoke  Outpatient Encounter Medications as of 05/10/2023  Medication Sig   albuterol (VENTOLIN HFA) 108 (90 Base) MCG/ACT inhaler Inhale 2 puffs into the lungs every 6 (six) hours as needed for wheezing or shortness of breath.   Aspirin-Acetaminophen-Caffeine (EXCEDRIN PO) Take 1 tablet by mouth daily as needed (for headache).    budesonide-formoterol (SYMBICORT) 160-4.5 MCG/ACT inhaler Inhale 2 puffs into the lungs in the morning and at bedtime.   Cholecalciferol (VITAMIN D) 2000 units CAPS Take 2 capsules by mouth daily.   clonazePAM (KLONOPIN) 0.5 MG tablet Take 0.5 mg by mouth daily as needed for anxiety.    Cranberry-Vitamin C-Vitamin E (CRANBERRY PLUS VITAMIN C PO) Take by mouth.   escitalopram (LEXAPRO) 20 MG tablet Take 20 mg by mouth daily.   fluticasone (FLONASE) 50 MCG/ACT nasal spray Place 1 spray into both nostrils daily.   Levonorgestrel (KYLEENA) 19.5 MG IUD by  Intrauterine route once.   Melatonin 3 MG CAPS Take by mouth.   Multiple Vitamins-Minerals (MULTIVITAMIN PO) Take 1 tablet by mouth daily.    ondansetron (ZOFRAN-ODT) 4 MG disintegrating tablet Take 1 tablet (4 mg total) by mouth every 8 (eight) hours as needed for nausea or vomiting.   Probiotic Product (PROBIOTIC DAILY PO) Take by mouth.   promethazine (PHENERGAN) 12.5 MG tablet Take 12.5 mg by mouth every 6 (six) hours as needed for nausea or vomiting.   simethicone (GAS-X) 80 MG chewable tablet Chew 2 tablets (160 mg total) by mouth 4 (four) times  daily as needed for flatulence (also available over the counter).   No facility-administered encounter medications on file as of 05/10/2023.     Review of Systems  Review of Systems  No chest pain with exertion.  Shortness of breath worse when lying supine.  No worsening lower extremity swelling awakening.  No significant dyspnea on exertion reliably.  Comprehensive review of systems otherwise negative. Physical Exam  There were no vitals taken for this visit.  Wt Readings from Last 5 Encounters:  07/11/22 203 lb 6.4 oz (92.3 kg)  05/22/22 195 lb (88.5 kg)  04/08/18 170 lb 6.4 oz (77.3 kg)  11/25/17 169 lb 3.2 oz (76.7 kg)  09/23/17 171 lb (77.6 kg)    BMI Readings from Last 5 Encounters:  07/11/22 37.20 kg/m  05/22/22 35.67 kg/m  04/08/18 31.93 kg/m  11/25/17 31.71 kg/m  09/23/17 32.05 kg/m     Physical Exam General: Sitting upright, no acute distress Eyes: EOMI, no icterus Neck: Range of motion appears intact Pulmonary: Normal work of breathing, on room air Neuro: No focal deficits noted Psych: Normal mood, full affect  Assessment & Plan:   History of prolonged bronchitis: Cough, wheeze, persisting for weeks.  Likely sign of poorly controlled asthma.  See below.  Asthma: Based on prolonged bronchitis and improvement with bronchodilators.  As well as atopic symptoms, seasonal allergies.  In addition, reported  environmental triggers of symptoms.  Improvement with albuterol.  CT scan demonstrates air trapping as well as bronchial thickening also consistent.  Symptoms largely improved with Symbicort high-dose for ICS/LABA therapy, 2 puff twice daily.  Refilled today.  Albuterol refilled today.  Orthopnea/wheeze: Historically, likely related to poorly controlled asthma symptoms.  No tracheobronchomalacia on CT scan 05/2022.  Improved with inhaler therapy.  Return in about 6 months (around 11/07/2023) for f/u Dr. Judeth Horn.   Karren Burly, MD 05/10/2023  Virtual Visit via Video Note  I connected with Shelby Ortiz on 05/10/23 at  3:00 PM EST by a video enabled telemedicine application and verified that I am speaking with the correct person using two identifiers.  Location: Patient: Home Provider: Office - [facility] - [address] [place], [address], [place], [place] [address]  I discussed the limitations of evaluation and management by telemedicine and the availability of in person appointments. The patient expressed understanding and agreed to proceed. I also discussed with the patient that there may be a patient responsible charge related to this service. The patient expressed understanding and agreed to proceed.  Patient consented to consult via telephone: Yes People present and their role in pt care: Pt

## 2023-08-16 ENCOUNTER — Other Ambulatory Visit: Payer: Self-pay | Admitting: Medical Genetics

## 2024-01-10 DIAGNOSIS — G4733 Obstructive sleep apnea (adult) (pediatric): Secondary | ICD-10-CM | POA: Diagnosis not present

## 2024-01-24 DIAGNOSIS — K50012 Crohn's disease of small intestine with intestinal obstruction: Secondary | ICD-10-CM | POA: Diagnosis not present

## 2024-01-28 DIAGNOSIS — R030 Elevated blood-pressure reading, without diagnosis of hypertension: Secondary | ICD-10-CM | POA: Diagnosis not present

## 2024-01-28 DIAGNOSIS — F419 Anxiety disorder, unspecified: Secondary | ICD-10-CM | POA: Diagnosis not present

## 2024-02-10 DIAGNOSIS — G4733 Obstructive sleep apnea (adult) (pediatric): Secondary | ICD-10-CM | POA: Diagnosis not present

## 2024-03-12 DIAGNOSIS — G4733 Obstructive sleep apnea (adult) (pediatric): Secondary | ICD-10-CM | POA: Diagnosis not present

## 2024-03-17 DIAGNOSIS — G43909 Migraine, unspecified, not intractable, without status migrainosus: Secondary | ICD-10-CM | POA: Diagnosis not present

## 2024-03-17 DIAGNOSIS — J4540 Moderate persistent asthma, uncomplicated: Secondary | ICD-10-CM | POA: Diagnosis not present

## 2024-03-17 DIAGNOSIS — F419 Anxiety disorder, unspecified: Secondary | ICD-10-CM | POA: Diagnosis not present

## 2024-03-17 DIAGNOSIS — I10 Essential (primary) hypertension: Secondary | ICD-10-CM | POA: Diagnosis not present

## 2024-03-17 DIAGNOSIS — K5090 Crohn's disease, unspecified, without complications: Secondary | ICD-10-CM | POA: Diagnosis not present

## 2024-04-14 DIAGNOSIS — I10 Essential (primary) hypertension: Secondary | ICD-10-CM | POA: Diagnosis not present

## 2024-04-27 ENCOUNTER — Other Ambulatory Visit: Payer: Self-pay | Admitting: Medical Genetics

## 2024-04-27 DIAGNOSIS — Z006 Encounter for examination for normal comparison and control in clinical research program: Secondary | ICD-10-CM

## 2024-05-12 DIAGNOSIS — J069 Acute upper respiratory infection, unspecified: Secondary | ICD-10-CM | POA: Diagnosis not present
